# Patient Record
Sex: Male | Born: 2000
Health system: Southern US, Community
[De-identification: ages and names within clinical notes are randomized; demographics above are authoritative.]

## PROBLEM LIST (undated history)

## (undated) DIAGNOSIS — L0291 Cutaneous abscess, unspecified: Secondary | ICD-10-CM

## (undated) DIAGNOSIS — T7840XA Allergy, unspecified, initial encounter: Secondary | ICD-10-CM

## (undated) HISTORY — DX: Allergy, unspecified, initial encounter: T78.40XA

## (undated) HISTORY — PX: FRACTURE SURGERY: SHX138

## (undated) HISTORY — PX: TONSILLECTOMY AND ADENOIDECTOMY: SUR1326

## (undated) HISTORY — PX: HYDROCELE EXCISION / REPAIR: SUR1145

---
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
## 2020-01-03 ENCOUNTER — Encounter (INDEPENDENT_AMBULATORY_CARE_PROVIDER_SITE_OTHER): Payer: Self-pay

## 2020-01-10 ENCOUNTER — Ambulatory Visit (INDEPENDENT_AMBULATORY_CARE_PROVIDER_SITE_OTHER): Payer: 59 | Admitting: Family Medicine

## 2020-01-11 ENCOUNTER — Ambulatory Visit: Payer: 59 | Admitting: Nurse Practitioner

## 2020-01-24 ENCOUNTER — Ambulatory Visit (INDEPENDENT_AMBULATORY_CARE_PROVIDER_SITE_OTHER): Payer: 59 | Admitting: Family Medicine

## 2020-01-29 ENCOUNTER — Ambulatory Visit (HOSPITAL_COMMUNITY): Payer: Self-pay

## 2020-02-07 MED FILL — DOXYCYCLINE MONO 100 MG CAP: 100 | 7 days supply | Qty: 14 | Fill #0

## 2020-02-07 MED FILL — METRONIDAZOLE 500 MG TABS: 500 | 7 days supply | Qty: 21 | Fill #0

## 2020-02-08 ENCOUNTER — Emergency Department (HOSPITAL_BASED_OUTPATIENT_CLINIC_OR_DEPARTMENT_OTHER)
Admission: EM | Admit: 2020-02-08 | Discharge: 2020-02-08 | Disposition: A | Discharge status: Home / Self Care | Payer: 59 | Attending: Emergency Medicine | Admitting: Emergency Medicine

## 2020-02-08 ENCOUNTER — Other Ambulatory Visit: Payer: Self-pay

## 2020-02-08 ENCOUNTER — Encounter (HOSPITAL_BASED_OUTPATIENT_CLINIC_OR_DEPARTMENT_OTHER): Payer: Self-pay | Admitting: *Deleted

## 2020-02-08 DIAGNOSIS — L0501 Pilonidal cyst with abscess: Secondary | ICD-10-CM | POA: Insufficient documentation

## 2020-02-08 DIAGNOSIS — R509 Fever, unspecified: Secondary | ICD-10-CM | POA: Insufficient documentation

## 2020-02-08 MED ORDER — ONDANSETRON 4 MG PO TBDP: 4.0000 mg | ORAL_TABLET | Freq: Three times a day (TID) | ORAL | 0 refills | Status: DC | PRN

## 2020-02-08 MED ORDER — ONDANSETRON 4 MG PO TBDP
4.0000 mg | ORAL_TABLET | Freq: Once | ORAL | Status: AC
  Administered 2020-02-08: 4 mg via ORAL
  Filled 2020-02-08: qty 1

## 2020-02-08 MED ORDER — LIDOCAINE-EPINEPHRINE (PF) 2 %-1:200000 IJ SOLN
20.0000 mL | Freq: Once | INTRAMUSCULAR | Status: AC
  Administered 2020-02-08: 20 mL via INTRADERMAL
  Filled 2020-02-08: qty 20

## 2020-02-08 MED ORDER — OXYCODONE-ACETAMINOPHEN 5-325 MG PO TABS
1.0000 | ORAL_TABLET | Freq: Once | ORAL | Status: AC
  Administered 2020-02-08: 1 via ORAL
  Filled 2020-02-08: qty 1

## 2020-02-08 MED ORDER — HYDROCODONE-ACETAMINOPHEN 5-325 MG PO TABS: 1.0000 | ORAL_TABLET | Freq: Four times a day (QID) | ORAL | 0 refills | Status: DC | PRN

## 2020-02-08 NOTE — ED Triage Notes (Signed)
Abscess to his lower back x 4 days.

## 2020-02-08 NOTE — ED Provider Notes (Signed)
[facility] EMERGENCY DEPARTMENT Provider Note   CSN: [id number] Arrival date & time: 02/08/20  1716     History Chief Complaint  Patient presents with  . Abscess    Donald Brooks is a 19 y.o. male who presents for evaluation of pain, redness, swelling noted to lower buttock x4 days.  Patient reports that the area has been painful to sit on.  He states initially, was very small but it kept getting worse.  He did a telehealth visit yesterday and was started on doxycycline and Flagyl which she has only taken 1 dose of.  He states that in the last for 4 hours, the area has become more painful, more swollen.  He also does report he has had fevers at home.  He states his fever today was 103.  He has been taking Advil and Tylenol.  He his last dose was about 5 PM this evening.  He states he has not had any abdominal pain.  He had 1-2 episodes of vomiting a few days ago that he attributed to pain.  He has been able to eat and drink.  He has not had any pain in his perineum.  He has not had any cough, chest pain, difficulty breathing, any other rash.  He has had Covid vaccines.  No exposure to Covid that he knows of.  The history is provided by the patient.       Past Medical History:  Diagnosis Date  . Abscess     Patient Active Problem List   Diagnosis Date Noted  . Elevated triglycerides with high cholesterol 12/26/2019  . Nausea 12/26/2019  . Chronic diarrhea 12/26/2019  . Left leg pain 06/27/2018  . Acanthosis nigricans 02/13/2018  . Low HDL (under 40) 02/13/2018  . Inattention 11/23/2017  . Severe obesity due to excess calories without serious comorbidity with body mass index (BMI) greater than 99th percentile for age in pediatric patient (HCC) 11/23/2017  . Lower leg mass, left 11/23/2017  . Epistaxis 04/27/2017    Past Surgical History:  Procedure Laterality Date  . FRACTURE SURGERY Left    fracture left wrist 2006  . HYDROCELE EXCISION / REPAIR    .  TONSILLECTOMY AND ADENOIDECTOMY         Family History  Problem Relation Age of Onset  . Diabetes Mother   . Hyperlipidemia Mother   . Hypothyroidism Mother   . Hypertension Father   . Cancer Maternal Grandmother        thyroid  . Sudden death Neg Hx   . Heart attack Neg Hx     Social History   Tobacco Use  . Smoking status: Never Smoker  . Smokeless tobacco: Never Used  Vaping Use  . Vaping Use: Every day  . Substances: Nicotine  Substance Use Topics  . Alcohol use: No    Alcohol/week: 0.0 standard drinks  . Drug use: No    Home Medications Prior to Admission medications   Medication Sig Start Date End Date Taking? Authorizing Provider  famotidine (PEPCID) 20 MG tablet Take 1 tablet (20 mg total) by mouth 2 (two) times daily. 12/26/19   Nche, Bonna Gainsharlotte Lum, NP  HYDROcodone-acetaminophen (NORCO/VICODIN) 5-325 MG tablet Take 1-2 tablets by mouth every 6 (six) hours as needed. 02/08/20   Maxwell CaulLayden, Jyasia Markoff A, PA-C  ondansetron (ZOFRAN ODT) 4 MG disintegrating tablet Take 1 tablet (4 mg total) by mouth every 8 (eight) hours as needed for nausea or vomiting. 02/08/20   Maxwell CaulLayden, Allexus Ovens A,  PA-C  psyllium (HYDROCIL/METAMUCIL) 95 % PACK Take 1 packet by mouth daily. 12/26/19   Nche, Bonna Gains, NP    Allergies    Sulfa antibiotics and Sulfamethoxazole  Review of Systems   Review of Systems  Constitutional: Positive for fever.  Respiratory: Negative for cough and shortness of breath.   Cardiovascular: Negative for chest pain.  Gastrointestinal: Negative for abdominal pain.  Genitourinary: Negative for dysuria and hematuria.  Skin: Positive for color change.  All other systems reviewed and are negative.   Physical Exam Updated Vital Signs BP (!) 142/78 (BP Location: Left Arm)   Pulse 100   Temp 98.7 F (37.1 C) (Oral)   Resp 18   Ht 6' 4.25" (1.937 m)   Wt (!) 178.3 kg   SpO2 99%   BMI 47.52 kg/m   Physical Exam Vitals and nursing note reviewed.  Constitutional:        Appearance: Normal appearance. He is well-developed.  HENT:     Head: Normocephalic and atraumatic.  Eyes:     General: Lids are normal.     Conjunctiva/sclera: Conjunctivae normal.     Pupils: Pupils are equal, round, and reactive to light.  Cardiovascular:     Rate and Rhythm: Normal rate and regular rhythm.     Pulses: Normal pulses.     Heart sounds: Normal heart sounds. No murmur heard.  No friction rub. No gallop.   Pulmonary:     Effort: Pulmonary effort is normal.  Abdominal:     Palpations: Abdomen is soft. Abdomen is not rigid.     Tenderness: There is no abdominal tenderness. There is no guarding.     Comments: Abdomen is soft, non-distended, non-tender. No rigidity, No guarding. No peritoneal signs.  Genitourinary:    Rectum: Normal. No tenderness.       Comments: The exam was performed with a chaperone present Ines Bloomer, Charity fundraiser).  Exam limited to patient comfort and position.  He was laying on his abdomen.  Right pilonidal area has a large area of warmth, induration, swelling with a central fluctuance noted towards the intergluteal cleft consistent with pilonidal abscess.  No tenderness palpation noted to the perineal area.  No crepitus.  Unable to visualize or palpate testicles. DRE showed no evidence of abscess to the perianal area. No tenderness noted on DRE.  Musculoskeletal:        General: Normal range of motion.     Cervical back: Full passive range of motion without pain.  Skin:    General: Skin is warm and dry.     Capillary Refill: Capillary refill takes less than 2 seconds.  Neurological:     Mental Status: He is alert and oriented to person, place, and time.  Psychiatric:        Speech: Speech normal.     ED Results / Procedures / Treatments   Labs (all labs ordered are listed, but only abnormal results are displayed) Labs Reviewed - No data to display  EKG None  Radiology No results found.  Procedures .Marland KitchenIncision and Drainage  Date/Time:  02/08/2020 10:57 PM Performed by: Maxwell Caul, PA-C Authorized by: Maxwell Caul, PA-C   Consent:    Consent obtained:  Verbal   Consent given by:  Patient   Risks discussed:  Bleeding, incomplete drainage, pain and damage to other organs   Alternatives discussed:  No treatment Universal protocol:    Procedure explained and questions answered to patient or proxy's satisfaction: yes  Relevant documents present and verified: yes     Test results available and properly labeled: yes     Imaging studies available: yes     Required blood products, implants, devices, and special equipment available: yes     Site/side marked: yes     Immediately prior to procedure a time out was called: yes     Patient identity confirmed:  Verbally with patient Location:    Type:  Abscess   Location:  Anogenital   Anogenital location:  Pilonidal Pre-procedure details:    Skin preparation:  Betadine Anesthesia (see MAR for exact dosages):    Anesthesia method:  Local infiltration   Local anesthetic:  Lidocaine 2% WITH epi Procedure type:    Complexity:  Complex Procedure details:    Incision types:  Single straight   Incision depth:  Subcutaneous   Scalpel blade:  11   Wound management:  Probed and deloculated, irrigated with saline and extensive cleaning   Drainage:  Purulent   Drainage amount:  Copious   Packing materials:  1/4 in gauze Post-procedure details:    Patient tolerance of procedure:  Tolerated well, no immediate complications Comments:     Once the area was anesthetized, sterilely simply cleaned with Betadine.  The area was opened up and showed a copious amount of purulent drainage.  This was evaluated and deloculated with blunt hemostats.  Given the size of pilonidal abscess, packing was placed.   (including critical care time)  Medications Ordered in ED Medications  oxyCODONE-acetaminophen (PERCOCET/ROXICET) 5-325 MG per tablet 1 tablet (1 tablet Oral Given 02/08/20  2145)  lidocaine-EPINEPHrine (XYLOCAINE W/EPI) 2 %-1:200000 (PF) injection 20 mL (20 mLs Intradermal Given 02/08/20 2147)  ondansetron (ZOFRAN-ODT) disintegrating tablet 4 mg (4 mg Oral Given 02/08/20 2145)    ED Course  I have reviewed the triage vital signs and the nursing notes.  Pertinent labs & imaging results that were available during my care of the patient were reviewed by me and considered in my medical decision making (see chart for details).    MDM Rules/Calculators/A&P                          19 year old male who presents for evaluation of pain noted to buttock x4 to 5 days.  He also reports he has had fevers at home.  Went to a telehealth visit the other day and was prescribed Doxy and Flagyl which he has taken for 1 day.  He states that the area became larger, more swollen, more painful.  Mom and patient report fever at home.  Today had a fever of 103.  He has not had any other symptoms.  On exam, he has a large area of warmth, erythema, induration with central fluctuance noted towards the gluteal cleft consistent with pilonidal abscess.  No tenderness noted on DRE.  Exam is not consistent with perirectal abscess.  Patient is afebrile here in the ED.  He has no other source of infection.  I did discuss with mom and patient regarding Covid testing but they declined.  He is overall well-appearing.  He does appear uncomfortable secondary to the pilonidal abscess but otherwise does not look ill appearing.  At this time, do not feel that he needs further lab work, imaging for evaluation.  His exam is not concerning for Fournier's. We will plan for I&D.  Discussed with mom and patient and they are agreeable.  I&D as documented above.  Patient tolerated  procedure well.  Patient is currently on doxycycline.  Encouraged him to take it.  Patient instructed on wound care precautions instructed to return to the ED in 2 days for wound check.  We will plan for short course premedication given acute  pain. At this time, patient exhibits no emergent life-threatening condition that require further evaluation in ED. Discussed patient with Dr. Bernette Mayers who is agreeable to plan. Patient had ample opportunity for questions and discussion. All patient's questions were answered with full understanding. Strict return precautions discussed. Patient expresses understanding and agreement to plan.   Portions of this note were generated with Scientist, clinical (histocompatibility and immunogenetics). Dictation errors may occur despite best attempts at proofreading.   Final Clinical Impression(s) / ED Diagnoses Final diagnoses:  Pilonidal abscess    Rx / DC Orders ED Discharge Orders         Ordered    ondansetron (ZOFRAN ODT) 4 MG disintegrating tablet  Every 8 hours PRN        02/08/20 2302    HYDROcodone-acetaminophen (NORCO/VICODIN) 5-325 MG tablet  Every 6 hours PRN        02/08/20 2302           Maxwell Caul, PA-C 02/08/20 2321    Pollyann Savoy, MD 02/08/20 2324

## 2020-02-08 NOTE — ED Notes (Signed)
Pt teaching provided on medications that may cause drowsiness. Pt instructed not to drive or operate heavy machinery while taking the prescribed medication. Pt verbalized understanding.   

## 2020-11-19 ENCOUNTER — Encounter: Payer: Self-pay | Admitting: Physician Assistant

## 2020-12-18 ENCOUNTER — Ambulatory Visit: Payer: 59 | Admitting: Physician Assistant

## 2021-01-12 ENCOUNTER — Other Ambulatory Visit (HOSPITAL_COMMUNITY): Payer: Self-pay

## 2021-01-12 ENCOUNTER — Ambulatory Visit
Admission: EM | Admit: 2021-01-12 | Discharge: 2021-01-12 | Disposition: A | Discharge status: Home / Self Care | Payer: 59 | Attending: Emergency Medicine | Admitting: Emergency Medicine

## 2021-01-12 ENCOUNTER — Other Ambulatory Visit: Payer: Self-pay

## 2021-01-12 ENCOUNTER — Ambulatory Visit (INDEPENDENT_AMBULATORY_CARE_PROVIDER_SITE_OTHER): Payer: 59

## 2021-01-12 DIAGNOSIS — M25512 Pain in left shoulder: Secondary | ICD-10-CM | POA: Diagnosis not present

## 2021-01-12 MED ORDER — PREDNISONE 20 MG PO TABS
40.0000 mg | ORAL_TABLET | Freq: Every day | ORAL | 0 refills | Status: DC
  Filled 2021-01-12: qty 10, 5d supply, fill #0

## 2021-01-12 MED ORDER — MELOXICAM 7.5 MG PO TABS
7.5000 mg | ORAL_TABLET | Freq: Every day | ORAL | 0 refills | Status: DC
  Filled 2021-01-12: qty 15, 15d supply, fill #0

## 2021-01-12 NOTE — ED Triage Notes (Signed)
Pt c/o lt shoulder pain x2 days with decrease mobility, denies injury.

## 2021-01-12 NOTE — Discharge Instructions (Addendum)
Rating todayYour x-ray today did not show injury to the bone, ligaments or tendons of your shoulder  You may take prednisone each morning with a little bit of food for the next 5 days  After completion of prednisone take meloxicam every morning with food for 5 days then may use medication as needed  Days worth of medication if still having pain or discomfort please follow-up with orthopedic specialist, information listed below, you may go to any orthopedic specialist of your choosing  You can apply heating pad in 15-minute intervals to affected area for additional comfort  As pain lessens begin to move arm around to help loosen tension

## 2021-01-12 NOTE — ED Provider Notes (Signed)
[facility]    CSN: [id number] Arrival date & time: 01/12/21  9528      History   Chief Complaint Chief Complaint  Patient presents with   Shoulder Pain    HPI Donald Brooks is a 20 y.o. male.   Patient presents with left shoulder pain beginning 2 days ago upon awakening.  Denies any precipitating event, injury or trauma, numbness, tingling, radiating pain.  Denies pushing, pulling or lifting over the last week.  Limited range of motion.  Attempted treatment.  Mother at bedside.  Past Medical History:  Diagnosis Date   Abscess     Patient Active Problem List   Diagnosis Date Noted   Elevated triglycerides with high cholesterol 12/26/2019   Nausea 12/26/2019   Chronic diarrhea 12/26/2019   Left leg pain 06/27/2018   Acanthosis nigricans 02/13/2018   Low HDL (under 40) 02/13/2018   Inattention 11/23/2017   Severe obesity due to excess calories without serious comorbidity with body mass index (BMI) greater than 99th percentile for age in pediatric patient (HCC) 11/23/2017   Lower leg mass, left 11/23/2017   Epistaxis 04/27/2017    Past Surgical History:  Procedure Laterality Date   FRACTURE SURGERY Left    fracture left wrist 2006   HYDROCELE EXCISION / REPAIR     TONSILLECTOMY AND ADENOIDECTOMY         Home Medications    Prior to Admission medications   Not on File    Family History Family History  Problem Relation Age of Onset   Diabetes Mother    Hyperlipidemia Mother    Hypothyroidism Mother    Hypertension Father    Cancer Maternal Grandmother        thyroid   Sudden death Neg Hx    Heart attack Neg Hx     Social History Social History   Tobacco Use   Smoking status: Never   Smokeless tobacco: Never  Vaping Use   Vaping Use: Every day   Substances: Nicotine  Substance Use Topics   Alcohol use: No    Alcohol/week: 0.0 standard drinks   Drug use: No     Allergies   Sulfa antibiotics and  Sulfamethoxazole   Review of Systems Review of Systems  Constitutional: Negative.   Respiratory: Negative.    Cardiovascular: Negative.   Skin: Negative.   Neurological: Negative.     Physical Exam Triage Vital Signs ED Triage Vitals [01/12/21 0950]  Enc Vitals Group     BP 139/83     Pulse Rate 67     Resp 18     Temp 97.9 F (36.6 C)     Temp Source Oral     SpO2 98 %     Weight      Height      Head Circumference      Peak Flow      Pain Score 7     Pain Loc      Pain Edu?      Excl. in GC?    No data found.  Updated Vital Signs BP 139/83 (BP Location: Right Arm)   Pulse 67   Temp 97.9 F (36.6 C) (Oral)   Resp 18   SpO2 98%   Visual Acuity Right Eye Distance:   Left Eye Distance:   Bilateral Distance:    Right Eye Near:   Left Eye Near:    Bilateral Near:     Physical Exam Constitutional:  Appearance: Normal appearance. He is normal weight.  HENT:     Head: Normocephalic.  Eyes:     Extraocular Movements: Extraocular movements intact.  Pulmonary:     Effort: Pulmonary effort is normal.  Musculoskeletal:     Comments: Tenderness over the right acromion, no point tenderness over the anterior posterior or lateral aspects of shoulder, forward flexion and abduction limited to 15 to 30 degrees, able to fully extend  Skin:    General: Skin is warm and dry.  Neurological:     Mental Status: He is alert and oriented to person, place, and time. Mental status is at baseline.  Psychiatric:        Behavior: Behavior normal.     UC Treatments / Results  Labs (all labs ordered are listed, but only abnormal results are displayed) Labs Reviewed - No data to display  EKG   Radiology No results found.  Procedures Procedures (including critical care time)  Medications Ordered in UC Medications - No data to display  Initial Impression / Assessment and Plan / UC Course  I have reviewed the triage vital signs and the nursing  notes.  Pertinent labs & imaging results that were available during my care of the patient were reviewed by me and considered in my medical decision making (see chart for details).  Acute left shoulder pain  1.  X-ray negative 2.  Prednisone 40 mg daily for 5 days 3.  After completion of prednisone, meloxicam 7.5 mg daily for 5 days then as needed 4.  Heating pad in 15-minute intervals for additional comfort 5.  Gentle stretching as tolerated 6.  If pain is persistent after completion of medication advised follow-up with orthopedic specialist for further evaluation Final Clinical Impressions(s) / UC Diagnoses   Final diagnoses:  None   Discharge Instructions   None    ED Prescriptions   None    PDMP not reviewed this encounter.   Valinda Hoar, NP 01/12/21 1120

## 2021-01-16 ENCOUNTER — Other Ambulatory Visit (HOSPITAL_COMMUNITY): Payer: Self-pay

## 2021-07-10 IMAGING — CT CT ABDOMEN AND PELVIS WITH CONTRAST
2 of 4 series · 17 of 46 positions shown, 19 images · IV contrast (OMNIPAQUE)
Comparison: None.

CLINICAL DATA: Abdominal pain

EXAM:
CT ABDOMEN AND PELVIS WITH CONTRAST
TECHNIQUE: Multidetector CT imaging of the abdomen and pelvis was performed
using the standard protocol following bolus administration of
intravenous contrast.
CONTRAST:  100mL OMNIPAQUE IOHEXOL 300 MG/ML  SOLN

[Series 2: axial st · axial · 0.97mm/px · z∈[+1072,+1522]mm · 14 of 102 slices shown, 16 images]
[im 6/102  soft-tissue]
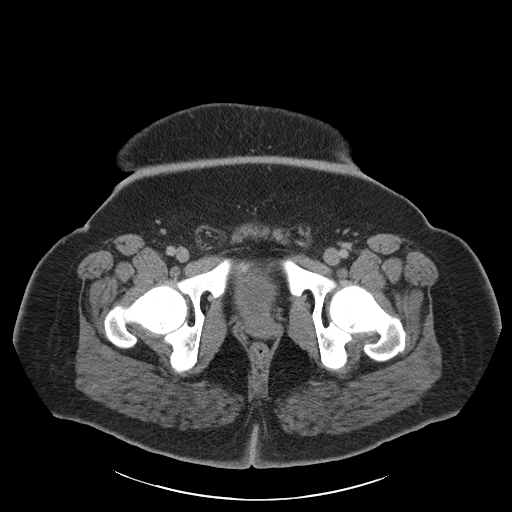
[im 6/102  bone]
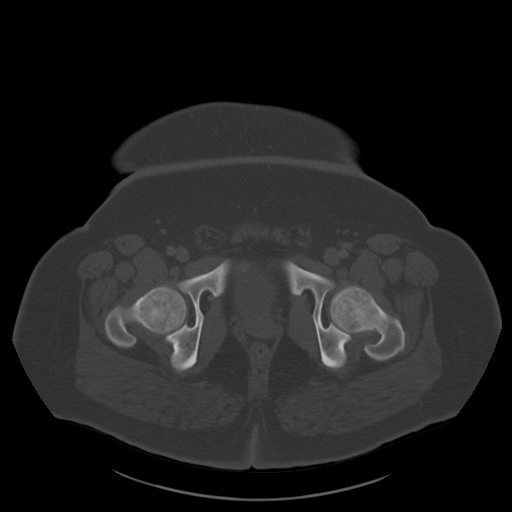
[im 11/102  soft-tissue]
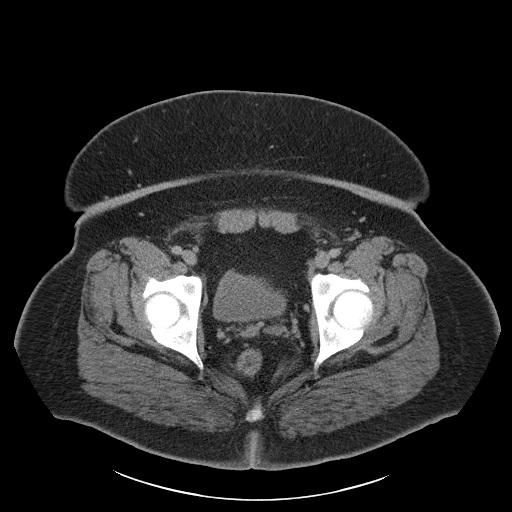
[im 22/102  soft-tissue]
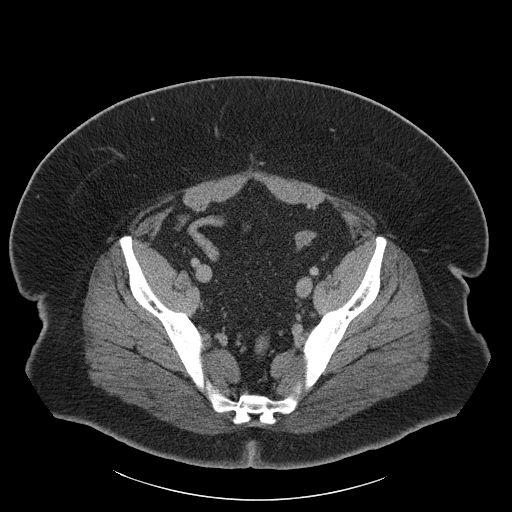
[im 27/102  soft-tissue]
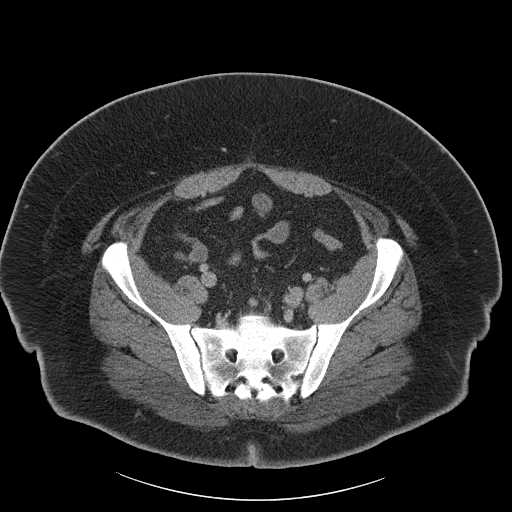
[im 32/102  soft-tissue]
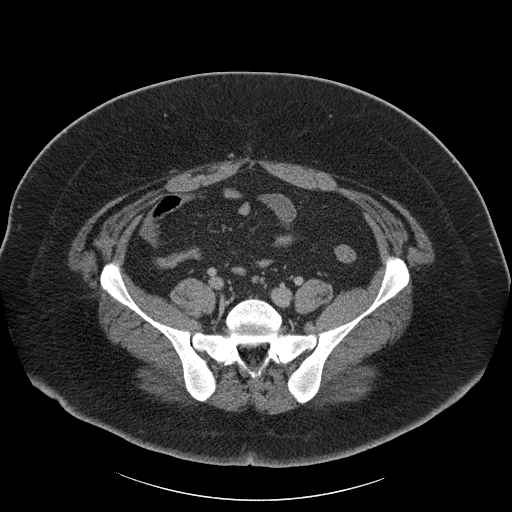
[im 43/102  soft-tissue]
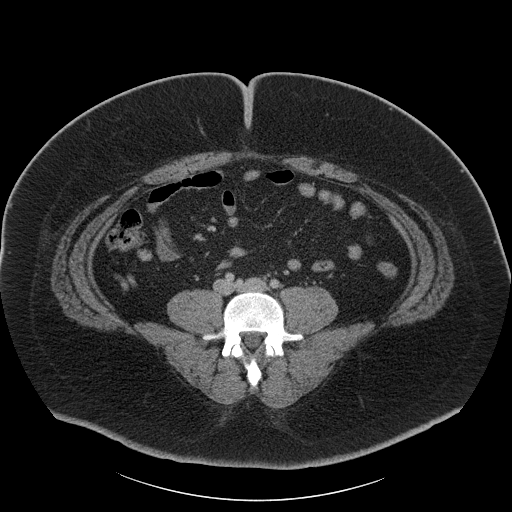
[im 48/102  soft-tissue]
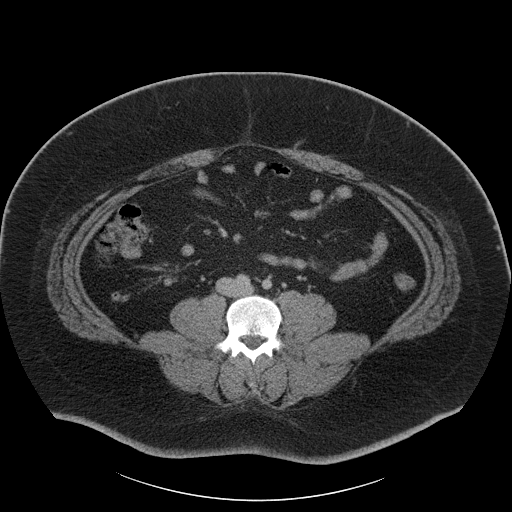
[im 54/102  soft-tissue]
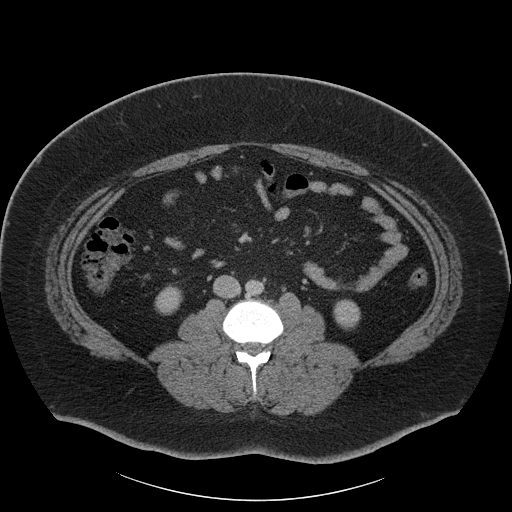
[im 59/102  soft-tissue]
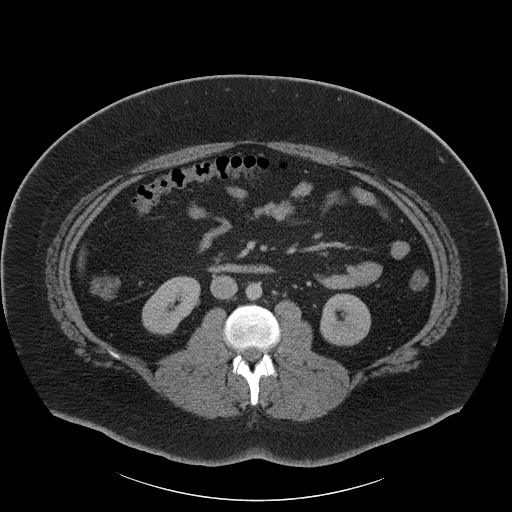
[im 59/102  bone]
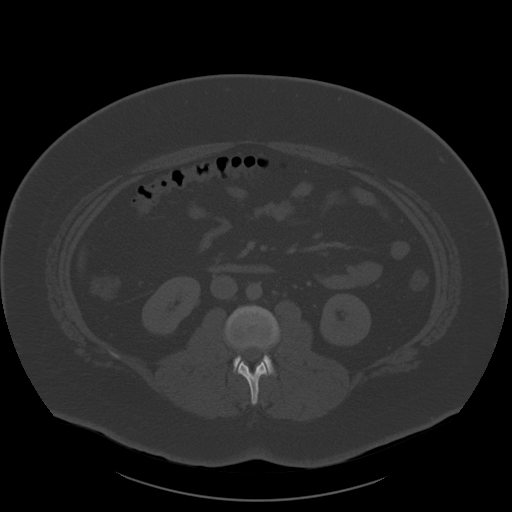
[im 70/102  soft-tissue]
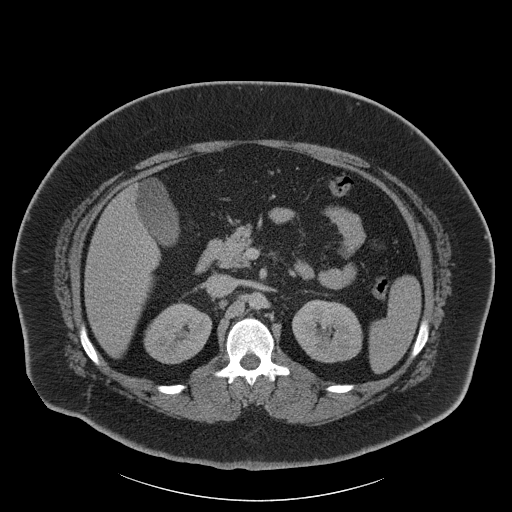
[im 75/102  soft-tissue]
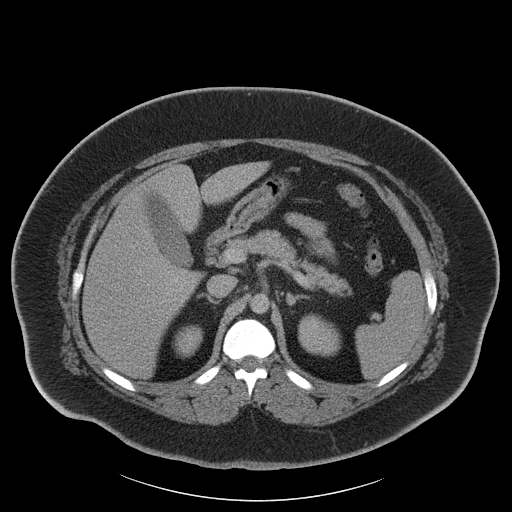
[im 80/102  soft-tissue]
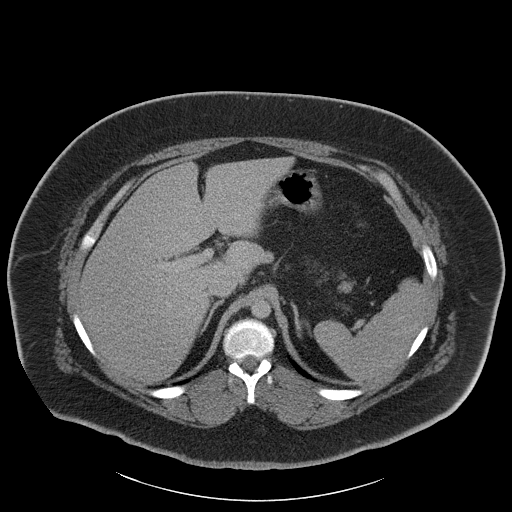
[im 91/102  soft-tissue]
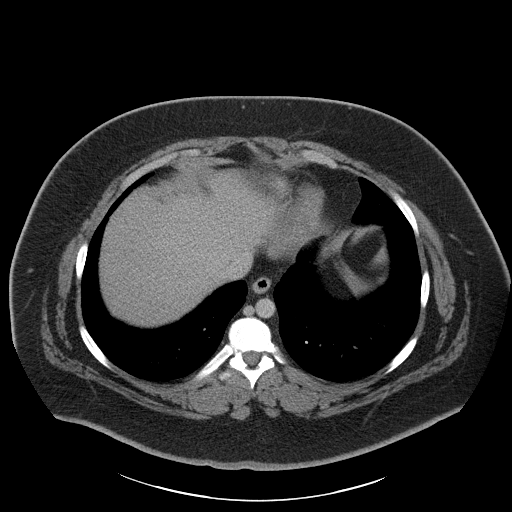
[im 96/102  soft-tissue]
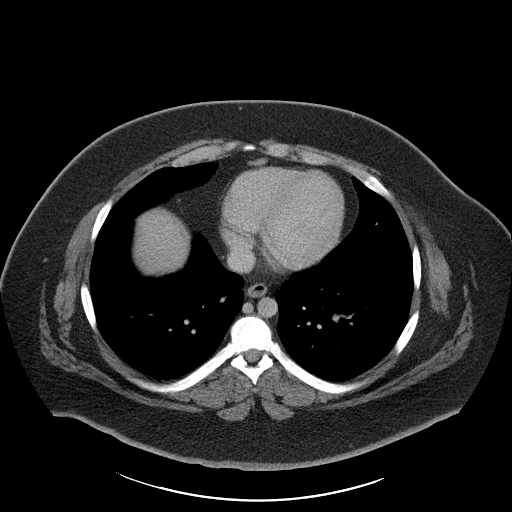

[Series 5: coronal st · coronal · 0.97mm/px · 3 of 179 slices shown]
[im 60/179  soft-tissue]
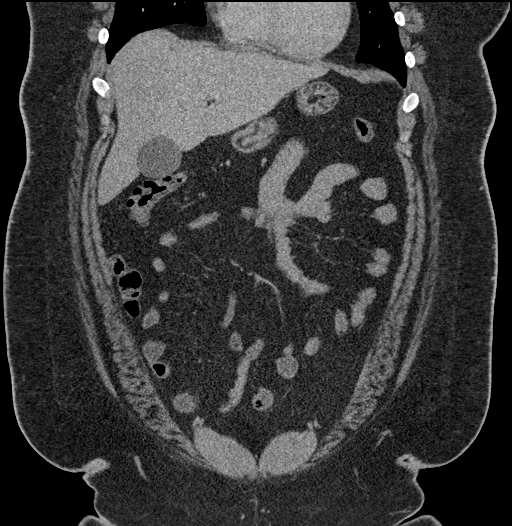
[im 80/179  soft-tissue]
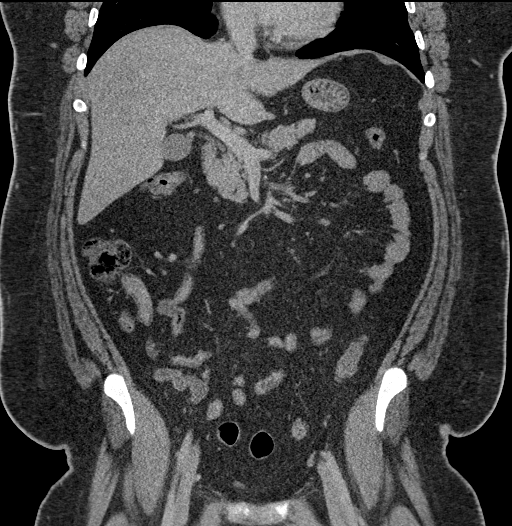
[im 99/179  soft-tissue]
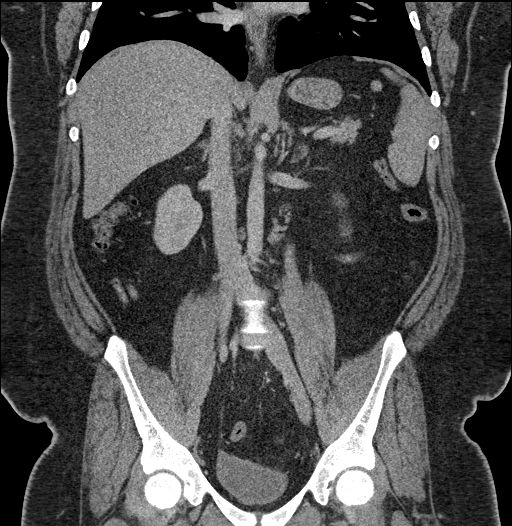

[17 of 46 positions shown; findings below may reference images not displayed]

FINDINGS: LOWER CHEST: There is no basilar pleural or apical pericardial
effusion.

HEPATOBILIARY: The hepatic contours and density are normal. There is
no intra- or extrahepatic biliary dilatation. The gallbladder is
normal.

PANCREAS: The pancreatic parenchymal contours are normal and there
is no ductal dilatation. There is no peripancreatic fluid
collection.

SPLEEN: Normal.

ADRENALS/URINARY TRACT:

--Adrenal glands: Normal.

--Right kidney/ureter: No hydronephrosis, nephroureterolithiasis,
perinephric stranding or solid renal mass.

--Left kidney/ureter: No hydronephrosis, nephroureterolithiasis,
perinephric stranding or solid renal mass.

--Urinary bladder: Normal for degree of distention

STOMACH/BOWEL:

--Stomach/Duodenum: There is no hiatal hernia or other gastric
abnormality. The duodenal course and caliber are normal.

--Small bowel: No dilatation or inflammation.

--Colon: No focal abnormality.

--Appendix: Normal.

VASCULAR/LYMPHATIC: Normal course and caliber of the major abdominal
vessels. No abdominal or pelvic lymphadenopathy.

REPRODUCTIVE: No free fluid in the pelvis.

MUSCULOSKELETAL. No bony spinal canal stenosis or focal osseous
abnormality.

OTHER: None.
IMPRESSION: Normal appendix.  No acute abnormality of the abdomen or pelvis.

## 2021-11-11 ENCOUNTER — Ambulatory Visit: Payer: 59 | Admitting: Family Medicine

## 2021-11-11 ENCOUNTER — Telehealth: Payer: Self-pay | Admitting: Nurse Practitioner

## 2021-11-11 NOTE — Telephone Encounter (Signed)
OK 

## 2021-11-11 NOTE — Telephone Encounter (Signed)
Pt would like to request a TOC to Dr.Wendling. please advise.

## 2021-12-09 ENCOUNTER — Encounter: Payer: Self-pay | Admitting: Family Medicine

## 2021-12-09 ENCOUNTER — Ambulatory Visit: Payer: BC Managed Care – PPO | Admitting: Family Medicine

## 2021-12-09 ENCOUNTER — Other Ambulatory Visit (HOSPITAL_COMMUNITY): Payer: Self-pay

## 2021-12-09 VITALS — BP 136/86 | HR 71 | Temp 99.5°F | Ht 76.0 in | Wt 373.4 lb

## 2021-12-09 DIAGNOSIS — Z1159 Encounter for screening for other viral diseases: Secondary | ICD-10-CM | POA: Diagnosis not present

## 2021-12-09 DIAGNOSIS — Z0000 Encounter for general adult medical examination without abnormal findings: Secondary | ICD-10-CM | POA: Diagnosis not present

## 2021-12-09 DIAGNOSIS — Z23 Encounter for immunization: Secondary | ICD-10-CM

## 2021-12-09 DIAGNOSIS — F40243 Fear of flying: Secondary | ICD-10-CM | POA: Diagnosis not present

## 2021-12-09 MED ORDER — SEMAGLUTIDE-WEIGHT MANAGEMENT 2.4 MG/0.75ML ~~LOC~~ SOAJ
2.4000 mg | SUBCUTANEOUS | 0 refills | Status: DC
  Filled 2021-12-09 – 2022-03-31 (×2): qty 3, 28d supply, fill #0

## 2021-12-09 MED ORDER — SEMAGLUTIDE-WEIGHT MANAGEMENT 1.7 MG/0.75ML ~~LOC~~ SOAJ
1.7000 mg | SUBCUTANEOUS | 0 refills | Status: AC
  Filled 2021-12-09 – 2022-03-03 (×2): qty 3, 28d supply, fill #0

## 2021-12-09 MED ORDER — DIAZEPAM 5 MG PO TABS
ORAL_TABLET | ORAL | 0 refills | Status: DC
  Filled 2021-12-09: qty 20, 20d supply, fill #0

## 2021-12-09 MED ORDER — SEMAGLUTIDE-WEIGHT MANAGEMENT 0.25 MG/0.5ML ~~LOC~~ SOAJ
0.2500 mg | SUBCUTANEOUS | 0 refills | Status: AC
  Filled 2021-12-09 – 2021-12-17 (×2): qty 2, 28d supply, fill #0

## 2021-12-09 MED ORDER — SEMAGLUTIDE-WEIGHT MANAGEMENT 1 MG/0.5ML ~~LOC~~ SOAJ
1.0000 mg | SUBCUTANEOUS | 0 refills | Status: AC
  Filled 2021-12-09 – 2022-02-04 (×2): qty 2, 28d supply, fill #0

## 2021-12-09 MED ORDER — SEMAGLUTIDE-WEIGHT MANAGEMENT 0.5 MG/0.5ML ~~LOC~~ SOAJ
0.5000 mg | SUBCUTANEOUS | 0 refills | Status: AC
  Filled 2021-12-09 – 2022-01-13 (×2): qty 2, 28d supply, fill #0

## 2021-12-09 NOTE — Progress Notes (Signed)
Chief Complaint  Patient presents with   New Patient (Initial Visit)    Well Male Donald Brooks is here for a complete physical/transfer of care.   His last physical was >1 year ago.  Current diet: in general, a "healthy" diet.   Current exercise: wt resistance exercise, some cardio Weight trend: intentionally losing Fatigue out of ordinary? No. Seat belt? Yes.    Health maintenance Tetanus- Yes HIV- Yes Hep C- No  Obesity Has had issues losing weight in past. Diet/exercise as above. Referred bariatric surgeyr in the past, underwent the courses at [facility] where things were supervised. He did not get the surgery, covid came and now he is interested in [place].   Past Medical History:  Diagnosis Date   Abscess    Allergy      Past Surgical History:  Procedure Laterality Date   FRACTURE SURGERY Left    fracture left wrist 2006   HYDROCELE EXCISION / REPAIR     TONSILLECTOMY AND ADENOIDECTOMY      Medications  Takes no meds routinely.    Allergies Allergies  Allergen Reactions   Sulfa Antibiotics Swelling and Rash   Sulfamethoxazole Rash    Family History Family History  Problem Relation Age of Onset   Diabetes Mother    Hyperlipidemia Mother    Hypothyroidism Mother    Diabetes Father    Hypertension Father    Birth defects Son    Cancer Maternal Grandmother        thyroid   Sudden death Neg Hx    Heart attack Neg Hx     Review of Systems: Constitutional: no fevers or chills Eye:  no recent significant change in vision Ear/Nose/Mouth/Throat:  Ears:  no hearing loss Nose/Mouth/Throat:  no complaints of nasal congestion, no sore throat Cardiovascular:  no chest pain Respiratory:  no shortness of breath Gastrointestinal:  no abdominal pain, no change in bowel habits GU:  Male: negative for dysuria Musculoskeletal/Extremities:  no pain of the joints Integumentary (Skin/Breast):  no abnormal skin lesions reported Neurologic:  no headaches Endocrine:  No unexpected weight gain Hematologic/Lymphatic:  no night sweats  Exam BP 136/86   Pulse 71   Temp 99.5 F (37.5 C) (Oral)   Ht 6\' 4"  (1.93 m)   Wt (!) 373 lb 6 oz (169.4 kg)   SpO2 98%   BMI 45.45 kg/m  General:  well developed, well nourished, in no apparent distress Skin:  no significant moles, warts, or growths Head:  no masses, lesions, or tenderness Eyes:  pupils equal and round, sclera anicteric without injection Ears:  canals without lesions, TMs shiny without retraction, no obvious effusion, no erythema Nose:  nares patent, septum midline, mucosa normal Throat/Pharynx:  lips and gingiva without lesion; tongue and uvula midline; non-inflamed pharynx; no exudates or postnasal drainage Neck: neck supple without adenopathy, thyromegaly, or masses Lungs:  clear to auscultation, breath sounds equal bilaterally, no respiratory distress Cardio:  regular rate and rhythm, no bruits, no LE edema Abdomen:  abdomen soft, nontender; bowel sounds normal; no masses or organomegaly Genital (male): Deferred Rectal: Deferred Musculoskeletal:  symmetrical muscle groups noted without atrophy or deformity Extremities:  no clubbing, cyanosis, or edema, no deformities, no skin discoloration Neuro:  gait normal; deep tendon reflexes normal and symmetric Psych: well oriented with normal range of affect and appropriate judgment/insight  Assessment and Plan  Well adult exam - Plan: CBC, Comprehensive metabolic panel, Lipid panel  Encounter for hepatitis C screening test for low risk  patient - Plan: Hepatitis C antibody  Morbid obesity (HCC) - Plan: TSH, Semaglutide-Weight Management 0.25 MG/0.5ML SOAJ, Semaglutide-Weight Management 0.5 MG/0.5ML SOAJ, Semaglutide-Weight Management 1 MG/0.5ML SOAJ, Semaglutide-Weight Management 1.7 MG/0.75ML SOAJ, Semaglutide-Weight Management 2.4 MG/0.75ML SOAJ  Anxiety with flying - Plan: diazepam (VALIUM) 5 MG tablet  Need for HPV vaccination - Plan: HPV  9-valent vaccine,Recombinat   Well 21 y.o. male. Counseled on diet and exercise. Self testicular exams recommended at least monthly.  Morbid obesity: Chronic, uncontrolled. Start Wegovy and uptitrate until 2.4 mg/week or highest tolerated dosage. Warned about price and availability.  1st HPV vaccine today, next in 1 mo, final in 6 mo.  Other orders as above. Follow up in 1 year pending the above workup. The patient voiced understanding and agreement to the plan.  Jilda Roche Morse, DO 12/09/21 1:34 PM

## 2021-12-09 NOTE — Patient Instructions (Addendum)
Give [place] 2-3 business days to get the results of your labs back.   Keep the diet clean and stay active.  Do monthly self testicular checks in the shower. You are feeling for lumps/bumps that don't belong. If you feel anything like this, let me know!  Consider the HPV vaccine.   Let us know if you need anything.

## 2021-12-10 LAB — LIPID PANEL
Cholesterol: 189 mg/dL (ref 0–200)
HDL: 35 mg/dL — ABNORMAL LOW (ref >39.00)
LDL Cholesterol: 129 mg/dL — ABNORMAL HIGH (ref 0–99)
NonHDL: 154.16
Total CHOL/HDL Ratio: 5
Triglycerides: 126 mg/dL (ref 0.0–149.0)
VLDL: 25.2 mg/dL (ref 0.0–40.0)

## 2021-12-10 LAB — COMPREHENSIVE METABOLIC PANEL
ALT: 27 U/L (ref 0–53)
AST: 16 U/L (ref 0–37)
Albumin: 4.6 g/dL (ref 3.5–5.2)
Alkaline Phosphatase: 35 U/L — ABNORMAL LOW (ref 39–117)
BUN: 13 mg/dL (ref 6–23)
CO2: 27 mEq/L (ref 19–32)
Calcium: 9.7 mg/dL (ref 8.4–10.5)
Chloride: 102 mEq/L (ref 96–112)
Creatinine, Ser: 0.88 mg/dL (ref 0.40–1.50)
GFR: 123.42 mL/min (ref >60.00)
Glucose, Bld: 85 mg/dL (ref 70–99)
Potassium: 4 mEq/L (ref 3.5–5.1)
Sodium: 139 mEq/L (ref 135–145)
Total Bilirubin: 0.7 mg/dL (ref 0.2–1.2)
Total Protein: 7.7 g/dL (ref 6.0–8.3)

## 2021-12-10 LAB — CBC
HCT: 45.1 % (ref 39.0–52.0)
Hemoglobin: 15.1 g/dL (ref 13.0–17.0)
MCHC: 33.5 g/dL (ref 30.0–36.0)
MCV: 86.1 fl (ref 78.0–100.0)
Platelets: 268 10*3/uL (ref 150.0–400.0)
RBC: 5.24 Mil/uL (ref 4.22–5.81)
RDW: 14.4 % (ref 11.5–14.6)
WBC: 6.4 10*3/uL (ref 4.5–10.5)

## 2021-12-10 LAB — HEPATITIS C ANTIBODY: Hepatitis C Ab: NONREACTIVE

## 2021-12-10 LAB — TSH: TSH: 0.91 u[IU]/mL (ref 0.35–5.50)

## 2021-12-12 ENCOUNTER — Other Ambulatory Visit (HOSPITAL_COMMUNITY): Payer: Self-pay

## 2021-12-14 ENCOUNTER — Other Ambulatory Visit (HOSPITAL_COMMUNITY): Payer: Self-pay

## 2021-12-14 ENCOUNTER — Encounter: Payer: Self-pay | Admitting: Family Medicine

## 2021-12-14 ENCOUNTER — Telehealth: Payer: Self-pay

## 2021-12-14 NOTE — Telephone Encounter (Signed)
PA initiated via Covermymeds; KEY: [id number]. Awaiting determination.   Pt's plan is requiring medical records. Unable to attach via Covermymeds; I have asked them to call us with their fax number.

## 2021-12-15 NOTE — Telephone Encounter (Signed)
Harrison Mons with [employer] called to request clinicals for prior auth on Wegovy. Advised that we needed fax number to fax medical records. Fax # is [phone]. He advised that the case number be added to the cover sheet. Case # [id number]

## 2021-12-15 NOTE — Telephone Encounter (Signed)
Notes faxed to number provided.

## 2021-12-16 ENCOUNTER — Other Ambulatory Visit (HOSPITAL_COMMUNITY): Payer: Self-pay

## 2021-12-16 NOTE — Telephone Encounter (Signed)
PA approved.   Request Reference Number: [id number]. WEGOVY INJ 0.25MG  is denied for not meeting the prior authorization requirement(s). Details of this decision are in the notice attached below or have been faxed to you. PAAppealSupportedResponse Appeal Case [id number] is overturned. For further questions, call [phone].

## 2021-12-16 NOTE — Telephone Encounter (Signed)
PA denied because records were not received prior to call from [place] at [place]. Appeal began, awaiting determination.

## 2021-12-17 ENCOUNTER — Other Ambulatory Visit (HOSPITAL_COMMUNITY): Payer: Self-pay

## 2021-12-17 NOTE — Telephone Encounter (Signed)
Received approval for 6 months, through 06/18/2022. Reviewed by [email or web address], RPh Pharmacist reviewer.  Called the patient to inform of approval.

## 2021-12-17 NOTE — Telephone Encounter (Signed)
Patient called back. Advised him that we were calling to advise him that his Donald Brooks was approved for 6 months.

## 2021-12-21 ENCOUNTER — Other Ambulatory Visit (HOSPITAL_COMMUNITY): Payer: Self-pay

## 2021-12-22 ENCOUNTER — Other Ambulatory Visit (HOSPITAL_COMMUNITY): Payer: Self-pay

## 2021-12-23 ENCOUNTER — Other Ambulatory Visit: Payer: Self-pay | Admitting: Family Medicine

## 2021-12-23 DIAGNOSIS — Z111 Encounter for screening for respiratory tuberculosis: Secondary | ICD-10-CM

## 2021-12-25 ENCOUNTER — Ambulatory Visit: Payer: BC Managed Care – PPO | Admitting: Family Medicine

## 2021-12-30 ENCOUNTER — Encounter (INDEPENDENT_AMBULATORY_CARE_PROVIDER_SITE_OTHER): Payer: Self-pay

## 2021-12-31 ENCOUNTER — Other Ambulatory Visit (INDEPENDENT_AMBULATORY_CARE_PROVIDER_SITE_OTHER): Payer: BC Managed Care – PPO

## 2021-12-31 DIAGNOSIS — Z111 Encounter for screening for respiratory tuberculosis: Secondary | ICD-10-CM

## 2022-01-04 LAB — QUANTIFERON-TB GOLD PLUS
Mitogen-NIL: >10 IU/mL
NIL: 0.18 IU/mL
QuantiFERON-TB Gold Plus: NEGATIVE
TB1-NIL: 0.06 IU/mL
TB2-NIL: 0.04 IU/mL

## 2022-01-13 ENCOUNTER — Other Ambulatory Visit (HOSPITAL_COMMUNITY): Payer: Self-pay

## 2022-01-15 ENCOUNTER — Other Ambulatory Visit (HOSPITAL_COMMUNITY): Payer: Self-pay

## 2022-02-04 ENCOUNTER — Other Ambulatory Visit (HOSPITAL_COMMUNITY): Payer: Self-pay

## 2022-03-03 ENCOUNTER — Other Ambulatory Visit (HOSPITAL_COMMUNITY): Payer: Self-pay

## 2022-03-31 ENCOUNTER — Other Ambulatory Visit (HOSPITAL_COMMUNITY): Payer: Self-pay

## 2022-05-03 ENCOUNTER — Other Ambulatory Visit (HOSPITAL_COMMUNITY): Payer: Self-pay

## 2022-05-03 ENCOUNTER — Other Ambulatory Visit: Payer: Self-pay | Admitting: Family Medicine

## 2022-05-03 MED ORDER — WEGOVY 2.4 MG/0.75ML ~~LOC~~ SOAJ
2.4000 mg | SUBCUTANEOUS | 0 refills | Status: DC
  Filled 2022-05-03: qty 3, 28d supply, fill #0

## 2022-05-06 ENCOUNTER — Other Ambulatory Visit (HOSPITAL_COMMUNITY): Payer: Self-pay

## 2022-06-08 ENCOUNTER — Other Ambulatory Visit: Payer: Self-pay | Admitting: Family Medicine

## 2022-06-08 ENCOUNTER — Other Ambulatory Visit (HOSPITAL_COMMUNITY): Payer: Self-pay

## 2022-06-08 MED ORDER — WEGOVY 2.4 MG/0.75ML ~~LOC~~ SOAJ
2.4000 mg | SUBCUTANEOUS | 0 refills | Status: DC
  Filled 2022-06-08: qty 3, 28d supply, fill #0

## 2022-06-08 NOTE — Telephone Encounter (Signed)
Do not see where you have prescribed this and is not on her current list.

## 2022-06-30 ENCOUNTER — Telehealth: Payer: Self-pay

## 2022-06-30 ENCOUNTER — Other Ambulatory Visit: Payer: Self-pay | Admitting: Family Medicine

## 2022-06-30 ENCOUNTER — Other Ambulatory Visit (HOSPITAL_COMMUNITY): Payer: Self-pay

## 2022-06-30 MED ORDER — WEGOVY 2.4 MG/0.75ML ~~LOC~~ SOAJ
2.4000 mg | SUBCUTANEOUS | 0 refills | Status: DC
  Filled 2022-06-30: qty 3, 28d supply, fill #0

## 2022-06-30 NOTE — Telephone Encounter (Signed)
Scheduled 07/05/2022 at 11 AM

## 2022-06-30 NOTE — Telephone Encounter (Signed)
Will need updated OV for weight and BMI documentation for PA renewal please.

## 2022-07-05 ENCOUNTER — Ambulatory Visit: Payer: BC Managed Care – PPO | Admitting: Family Medicine

## 2022-07-05 ENCOUNTER — Other Ambulatory Visit (HOSPITAL_COMMUNITY): Payer: Self-pay

## 2022-07-05 ENCOUNTER — Encounter: Payer: Self-pay | Admitting: Family Medicine

## 2022-07-05 DIAGNOSIS — Z23 Encounter for immunization: Secondary | ICD-10-CM

## 2022-07-05 MED ORDER — WEGOVY 2.4 MG/0.75ML ~~LOC~~ SOAJ
2.4000 mg | SUBCUTANEOUS | 2 refills | Status: DC
  Filled 2022-07-05: qty 3, 28d supply, fill #0

## 2022-07-05 NOTE — Progress Notes (Signed)
Chief Complaint  Patient presents with   Follow-up    [place] Document Weight/Height/BMI    Subjective: Patient is a 22 y.o. male here for f/u.  Taking Wegovy 2.4 mg/week. Compliant, no AE's. Has lost 40 lbs. Diet improved. Cravings are significantly better. Portions are better. Lifting wts, doing cardio. No CP or SOB. Concerned about loose skin.   Past Medical History:  Diagnosis Date   Abscess    Allergy     Objective: BP 120/80 (BP Location: Left Arm, Patient Position: Sitting, Cuff Size: Large)   Pulse 97   Temp 98.7 F (37.1 C) (Oral)   Ht 6' 4"$  (1.93 m)   Wt (!) 333 lb 4 oz (151.2 kg)   SpO2 98%   BMI 40.56 kg/m  General: Awake, appears stated age Heart: RRR, no LE edema Lungs: CTAB, no rales, wheezes or rhonchi. No accessory muscle use Psych: Age appropriate judgment and insight, normal affect and mood  Assessment and Plan: Morbid obesity ([place]) - Plan: Semaglutide-Weight Management (WEGOVY) 2.4 MG/0.75ML SOAJ  Chronic, stable. Cont Wegovy 2.4 mg/week. Counseled on diet/exercise. Commended for his weight loss thus far.  HPV 2/3 today.  Tdap booster today. F/u in 6 mo for CPE or prn.  The patient voiced understanding and agreement to the plan.  [place], DO 07/05/22  11:17 AM

## 2022-07-05 NOTE — Patient Instructions (Signed)
Strong work with your weight loss.  Keep the diet clean and stay active.  Let us know if you need anything.

## 2022-07-05 NOTE — Addendum Note (Signed)
Addended by: Laure Kidney on: 07/05/2022 11:56 AM   Modules accepted: Orders

## 2022-07-06 NOTE — Telephone Encounter (Signed)
PA initiated via Covermymeds; [id number]. Awaiting determination.

## 2022-07-06 NOTE — Telephone Encounter (Signed)
Called the patient no answer/no VM to leave a message.

## 2022-07-06 NOTE — Telephone Encounter (Signed)
Plz inform pt. We could do a 90 d supply of an oral medication but that is not something we can keep him on long term.

## 2022-07-06 NOTE — Telephone Encounter (Signed)
PA denied. Plan exclusion.   The requested medication is not covered by the plan. Medications used for weight loss are not covered under your health plan's pharmacy coverage.

## 2022-07-06 NOTE — Telephone Encounter (Signed)
Called informed the patient of denial He will call back after he talks to his mom what he wants to do.

## 2022-07-08 ENCOUNTER — Other Ambulatory Visit (HOSPITAL_COMMUNITY): Payer: Self-pay

## 2022-07-08 MED ORDER — PHENTERMINE HCL 37.5 MG PO CAPS
37.5000 mg | ORAL_CAPSULE | ORAL | 0 refills | Status: DC
  Filled 2022-07-08: qty 30, 30d supply, fill #0

## 2022-07-08 NOTE — Telephone Encounter (Signed)
Sent for 30 d. I would like to see him in about a mo to see how he is going on this one. Ty.

## 2022-07-08 NOTE — Telephone Encounter (Signed)
Pt called back stating that he would like to do the oral medication that Dr. Nani Ravens was recommending.

## 2022-07-08 NOTE — Addendum Note (Signed)
Addended by: Ames Coupe on: 07/08/2022 04:20 PM   Modules accepted: Orders

## 2022-07-09 ENCOUNTER — Other Ambulatory Visit (HOSPITAL_COMMUNITY): Payer: Self-pay

## 2022-07-09 NOTE — Telephone Encounter (Signed)
Called and scheduled appt

## 2022-08-06 ENCOUNTER — Ambulatory Visit: Payer: BC Managed Care – PPO | Admitting: Family Medicine

## 2022-08-09 ENCOUNTER — Ambulatory Visit: Payer: BC Managed Care – PPO | Admitting: Family Medicine

## 2022-08-09 ENCOUNTER — Encounter: Payer: Self-pay | Admitting: Family Medicine

## 2022-08-09 ENCOUNTER — Other Ambulatory Visit (HOSPITAL_COMMUNITY): Payer: Self-pay

## 2022-08-09 VITALS — BP 112/76 | HR 82 | Temp 97.8°F | Ht 76.0 in | Wt 318.1 lb

## 2022-08-09 DIAGNOSIS — E669 Obesity, unspecified: Secondary | ICD-10-CM

## 2022-08-09 HISTORY — DX: Obesity, unspecified: E66.9

## 2022-08-09 MED ORDER — PHENTERMINE HCL 37.5 MG PO CAPS
37.5000 mg | ORAL_CAPSULE | ORAL | 0 refills | Status: DC
  Filled 2022-08-09: qty 30, 30d supply, fill #0

## 2022-08-09 MED ORDER — PHENTERMINE HCL 37.5 MG PO CAPS
37.5000 mg | ORAL_CAPSULE | ORAL | 0 refills | Status: DC
  Filled 2022-09-15: qty 30, 30d supply, fill #0

## 2022-08-09 NOTE — Patient Instructions (Signed)
Strong work losing weight.   Keep the diet clean and stay active.  Let us know if you need anything.

## 2022-08-09 NOTE — Progress Notes (Signed)
Chief Complaint  Patient presents with   Follow-up    1 month Phentermine    Subjective: Patient is a 22 y.o. male here for f/u.  Has been phentermine 37.5 mg/d. Reports compliance. Makes him irritable and sometimes affects sleep. Mild overall. He is lifting weights and doing some cardio.   Past Medical History:  Diagnosis Date   Abscess    Allergy     Objective: BP 112/76 (BP Location: Right Arm, Patient Position: Sitting, Cuff Size: Large)   Pulse 82   Temp 97.8 F (36.6 C) (Oral)   Ht 6\' 4"  (1.93 m)   Wt (!) 318 lb 2 oz (144.3 kg)   SpO2 96%   BMI 38.72 kg/m  General: Awake, appears stated age Heart: RRR, no LE edema Lungs: CTAB, no rales, wheezes or rhonchi. No accessory muscle use Psych: Age appropriate judgment and insight, normal affect and mood  Assessment and Plan: Obesity (BMI 30-39.9) - Plan: phentermine 37.5 MG capsule, phentermine 37.5 MG capsule  Chronic, improving continue 60 more days of phentermine. Counseled on diet/exercise. F/u in 4 mo for CPE.  The patient voiced understanding and agreement to the plan.  [place], DO 08/09/22  11:10 AM

## 2022-08-10 ENCOUNTER — Emergency Department (HOSPITAL_BASED_OUTPATIENT_CLINIC_OR_DEPARTMENT_OTHER)
Admission: EM | Admit: 2022-08-10 | Discharge: 2022-08-11 | Disposition: A | Discharge status: Home / Self Care | Payer: BC Managed Care – PPO | Attending: Emergency Medicine | Admitting: Emergency Medicine

## 2022-08-10 ENCOUNTER — Other Ambulatory Visit: Payer: Self-pay

## 2022-08-10 DIAGNOSIS — K6289 Other specified diseases of anus and rectum: Secondary | ICD-10-CM | POA: Diagnosis not present

## 2022-08-10 DIAGNOSIS — R1013 Epigastric pain: Secondary | ICD-10-CM | POA: Diagnosis not present

## 2022-08-10 LAB — COMPREHENSIVE METABOLIC PANEL
ALT: 51 U/L — ABNORMAL HIGH (ref 0–44)
AST: 25 U/L (ref 15–41)
Albumin: 4 g/dL (ref 3.5–5.0)
Alkaline Phosphatase: 33 U/L — ABNORMAL LOW (ref 38–126)
Anion gap: 5 (ref 5–15)
BUN: 17 mg/dL (ref 6–20)
CO2: 24 mmol/L (ref 22–32)
Calcium: 8.9 mg/dL (ref 8.9–10.3)
Chloride: 106 mmol/L (ref 98–111)
Creatinine, Ser: 0.92 mg/dL (ref 0.61–1.24)
GFR, Estimated: >60 mL/min (ref >60)
Glucose, Bld: 103 mg/dL — ABNORMAL HIGH (ref 70–99)
Potassium: 3.7 mmol/L (ref 3.5–5.1)
Sodium: 135 mmol/L (ref 135–145)
Total Bilirubin: 0.7 mg/dL (ref 0.3–1.2)
Total Protein: 7.7 g/dL (ref 6.5–8.1)

## 2022-08-10 LAB — URINALYSIS, ROUTINE W REFLEX MICROSCOPIC
Glucose, UA: NEGATIVE mg/dL
Hgb urine dipstick: NEGATIVE
Ketones, ur: NEGATIVE mg/dL
Leukocytes,Ua: NEGATIVE
Nitrite: NEGATIVE
Protein, ur: 30 mg/dL — AB
Specific Gravity, Urine: >=1.03 (ref 1.005–1.030)
pH: 5.5 (ref 5.0–8.0)

## 2022-08-10 LAB — CBC
HCT: 43.1 % (ref 39.0–52.0)
Hemoglobin: 14.4 g/dL (ref 13.0–17.0)
MCH: 28.8 pg (ref 26.0–34.0)
MCHC: 33.4 g/dL (ref 30.0–36.0)
MCV: 86.2 fL (ref 80.0–100.0)
Platelets: 261 10*3/uL (ref 150–400)
RBC: 5 MIL/uL (ref 4.22–5.81)
RDW: 13.7 % (ref 11.5–15.5)
WBC: 7.9 10*3/uL (ref 4.0–10.5)
nRBC: 0 % (ref 0.0–0.2)

## 2022-08-10 LAB — URINALYSIS, MICROSCOPIC (REFLEX): WBC, UA: NONE SEEN WBC/hpf (ref 0–5)

## 2022-08-10 LAB — LIPASE, BLOOD: Lipase: 40 U/L (ref 11–51)

## 2022-08-10 NOTE — ED Triage Notes (Signed)
Pt reports middle/epigastric abdominal pain for the last three days. Pt also reports rectal bleeding and pain with hx of hemorrhoids. Pt reports starting phentermine 2 weeks ago.

## 2022-08-11 NOTE — ED Provider Notes (Signed)
[facility]  Provider Note  CSN: [id number] Arrival date & time: 08/10/22 2220  History Chief Complaint  Patient presents with   Abdominal Pain   Rectal Pain    Donald Brooks is a 22 y.o. male here with two complaints, first is rectal pain. On going for several days after having a large hard BM, has had soft stools since then but persistent rectal pain and occasional blood on stools. He reports using multiple hemorrhoid remedies without improvement. Also having intermittent aching, epigastric pain, 'like he'd been punched in the gut'. No particular provoking or relieving factors. No nausea or vomiting. No fever. He was recently started on phentermine for weight loss.    Home Medications Prior to Admission medications   Medication Sig Start Date End Date Taking? Authorizing Provider  diazepam (VALIUM) 5 MG tablet Take 1 tablet by mouth 30-60 minutes prior to flights. 12/09/21   Shelda Pal, DO  phentermine 37.5 MG capsule Take 1 capsule by mouth every morning. 08/09/22   Shelda Pal, DO  phentermine 37.5 MG capsule Take 1 capsule (37.5 mg total) by mouth every morning. 09/08/22   Shelda Pal, DO     Allergies    Sulfa antibiotics and Sulfamethoxazole   Review of Systems   Review of Systems Please see HPI for pertinent positives and negatives  Physical Exam BP 130/80   Pulse 84   Temp 98.9 F (37.2 C) (Oral)   Resp 18   SpO2 98%   Physical Exam Vitals and nursing note reviewed.  Constitutional:      Appearance: Normal appearance.  HENT:     Head: Normocephalic and atraumatic.     Nose: Nose normal.     Mouth/Throat:     Mouth: Mucous membranes are moist.  Eyes:     Extraocular Movements: Extraocular movements intact.     Conjunctiva/sclera: Conjunctivae normal.  Cardiovascular:     Rate and Rhythm: Normal rate.  Pulmonary:     Effort: Pulmonary effort is normal.     Breath sounds:  Normal breath sounds.  Abdominal:     General: Abdomen is flat.     Palpations: Abdomen is soft.     Tenderness: There is abdominal tenderness (mild) in the epigastric area. There is no guarding. Negative signs include Murphy's sign and McBurney's sign.  Genitourinary:    Comments: Chaperone present, normal rectum externally, no hemorrhoid, fissure, bleeding or swelling.He refused DRE Musculoskeletal:        General: No swelling. Normal range of motion.     Cervical back: Neck supple.  Skin:    General: Skin is warm and dry.  Neurological:     General: No focal deficit present.     Mental Status: He is alert.  Psychiatric:        Mood and Affect: Mood normal.     ED Results / Procedures / Treatments   EKG None  Procedures Procedures  Medications Ordered in the ED Medications - No data to display  Initial Impression and Plan  Patient here with intermittent epigastric pain and rectal pain. Seems to be two different problems. His abdomen is benign, labs done in triage show normal CBC, CMP, Lipase and UA. Possible this is a side effect of phentermine, or recent use of NSAIDs. He also has rectal pain after a large hard stool last week. Possible he has a fissure, Low likelihood of abscess but would not allow detailed rectal exam beyond  looking externally. Advised to use anusol instead of hemorrhoid creams. Recommend he follow up with his PCP for further evaluation. RTED for any other concerns.   ED Course       MDM Rules/Calculators/A&P Medical Decision Making Problems Addressed: Epigastric pain: acute illness or injury Rectal pain: acute illness or injury  Amount and/or Complexity of Data Reviewed Labs: ordered. Decision-making details documented in ED Course.  Risk OTC drugs.     Final Clinical Impression(s) / ED Diagnoses Final diagnoses:  Epigastric pain  Rectal pain    Rx / DC Orders ED Discharge Orders     None        Truddie Hidden, MD 08/11/22  [phone]

## 2022-08-11 NOTE — ED Notes (Signed)
Discharge paperwork reviewed entirely with patient, including Rx's and follow up care. Pain was under control. Pt verbalized understanding as well as all parties involved. No questions or concerns voiced at the time of discharge. No acute distress noted.   Pt ambulated out to PVA without incident or assistance.  

## 2022-08-30 ENCOUNTER — Encounter: Payer: Self-pay | Admitting: Family Medicine

## 2022-09-15 ENCOUNTER — Other Ambulatory Visit (HOSPITAL_COMMUNITY): Payer: Self-pay

## 2022-09-20 ENCOUNTER — Encounter: Payer: Self-pay | Admitting: Family Medicine

## 2022-09-21 ENCOUNTER — Other Ambulatory Visit: Payer: Self-pay | Admitting: Family Medicine

## 2022-09-21 ENCOUNTER — Emergency Department (HOSPITAL_COMMUNITY): Payer: BC Managed Care – PPO

## 2022-09-21 ENCOUNTER — Other Ambulatory Visit (HOSPITAL_COMMUNITY): Payer: Self-pay

## 2022-09-21 ENCOUNTER — Other Ambulatory Visit: Payer: Self-pay

## 2022-09-21 ENCOUNTER — Emergency Department (HOSPITAL_COMMUNITY)
Admission: EM | Admit: 2022-09-21 | Discharge: 2022-09-21 | Disposition: A | Discharge status: Home / Self Care | Payer: BC Managed Care – PPO | Attending: Emergency Medicine | Admitting: Emergency Medicine

## 2022-09-21 ENCOUNTER — Encounter (HOSPITAL_COMMUNITY): Payer: Self-pay

## 2022-09-21 DIAGNOSIS — F40243 Fear of flying: Secondary | ICD-10-CM

## 2022-09-21 DIAGNOSIS — K6289 Other specified diseases of anus and rectum: Secondary | ICD-10-CM

## 2022-09-21 DIAGNOSIS — K625 Hemorrhage of anus and rectum: Secondary | ICD-10-CM | POA: Diagnosis present

## 2022-09-21 DIAGNOSIS — R1031 Right lower quadrant pain: Secondary | ICD-10-CM | POA: Diagnosis not present

## 2022-09-21 LAB — CBC WITH DIFFERENTIAL/PLATELET
Abs Immature Granulocytes: 0.02 10*3/uL (ref 0.00–0.07)
Basophils Absolute: 0 10*3/uL (ref 0.0–0.1)
Basophils Relative: 1 %
Eosinophils Absolute: 0.1 10*3/uL (ref 0.0–0.5)
Eosinophils Relative: 1 %
HCT: 43.2 % (ref 39.0–52.0)
Hemoglobin: 14.5 g/dL (ref 13.0–17.0)
Immature Granulocytes: 0 %
Lymphocytes Relative: 26 %
Lymphs Abs: 2.3 10*3/uL (ref 0.7–4.0)
MCH: 28.9 pg (ref 26.0–34.0)
MCHC: 33.6 g/dL (ref 30.0–36.0)
MCV: 86.2 fL (ref 80.0–100.0)
Monocytes Absolute: 0.6 10*3/uL (ref 0.1–1.0)
Monocytes Relative: 7 %
Neutro Abs: 5.8 10*3/uL (ref 1.7–7.7)
Neutrophils Relative %: 65 %
Platelets: 264 10*3/uL (ref 150–400)
RBC: 5.01 MIL/uL (ref 4.22–5.81)
RDW: 13.6 % (ref 11.5–15.5)
WBC: 8.8 10*3/uL (ref 4.0–10.5)
nRBC: 0 % (ref 0.0–0.2)

## 2022-09-21 LAB — COMPREHENSIVE METABOLIC PANEL
ALT: 23 U/L (ref 0–44)
AST: 17 U/L (ref 15–41)
Albumin: 4 g/dL (ref 3.5–5.0)
Alkaline Phosphatase: 34 U/L — ABNORMAL LOW (ref 38–126)
Anion gap: 8 (ref 5–15)
BUN: 13 mg/dL (ref 6–20)
CO2: 26 mmol/L (ref 22–32)
Calcium: 8.9 mg/dL (ref 8.9–10.3)
Chloride: 102 mmol/L (ref 98–111)
Creatinine, Ser: 0.91 mg/dL (ref 0.61–1.24)
GFR, Estimated: >60 mL/min (ref >60)
Glucose, Bld: 92 mg/dL (ref 70–99)
Potassium: 3.5 mmol/L (ref 3.5–5.1)
Sodium: 136 mmol/L (ref 135–145)
Total Bilirubin: 0.9 mg/dL (ref 0.3–1.2)
Total Protein: 7.6 g/dL (ref 6.5–8.1)

## 2022-09-21 LAB — OCCULT BLOOD X 1 CARD TO LAB, STOOL: Fecal Occult Bld: POSITIVE — AB

## 2022-09-21 LAB — LIPASE, BLOOD: Lipase: 29 U/L (ref 11–51)

## 2022-09-21 MED ORDER — DIAZEPAM 5 MG PO TABS
ORAL_TABLET | ORAL | 0 refills | Status: DC
  Filled 2022-09-21: qty 10, 10d supply, fill #0

## 2022-09-21 MED ORDER — IOHEXOL 300 MG/ML  SOLN
100.0000 mL | Freq: Once | INTRAMUSCULAR | Status: AC | PRN
  Administered 2022-09-21: 100 mL via INTRAVENOUS

## 2022-09-21 MED ORDER — MORPHINE SULFATE (PF) 4 MG/ML IV SOLN
4.0000 mg | Freq: Once | INTRAVENOUS | Status: AC
  Administered 2022-09-21: 4 mg via INTRAVENOUS
  Filled 2022-09-21: qty 1

## 2022-09-21 NOTE — ED Provider Notes (Signed)
[place] EM[facility] DEPARTMENT AT [facility] Provider Note   CSN: [id number] Arrival date & time: 09/21/22  1815     History  Chief Complaint  Patient presents with   Rectal Pain    Kale Gillingham is a 22 y.o. male with a past medical history of obesity presenting today with concern of rectal pain and bleeding.  He reports yesterday morning he had a bowel movement and afterward had bright red blood coming out of his rectum.  There is no blood in the stool.  No thinners or history of GI bleed.  No family history of IBS/IBD.  Says that ever since then he has been having some dull pain in his rectum however occasionally he will have sharp pain that lasts for 30 minutes and he is unable to move.  No nausea, vomiting, diarrhea or constipation.  Reports that he has normal bowel movements and occasionally takes a stool softener.  Denies foreign bodies used in the rectum and anal receptive intercourse HPI     Home Medications Prior to Admission medications   Medication Sig Start Date End Date Taking? Authorizing Provider  diazepam (VALIUM) 5 MG tablet Take 1 tablet by mouth 30-60 minutes prior to flights. 09/21/22   Sharlene Dory, DO  phentermine 37.5 MG capsule Take 1 capsule by mouth every morning. 08/09/22   Sharlene Dory, DO  phentermine 37.5 MG capsule Take 1 capsule (37.5 mg total) by mouth every morning. 09/08/22   Sharlene Dory, DO      Allergies    Sulfa antibiotics and Sulfamethoxazole    Review of Systems   Review of Systems  Physical Exam Updated Vital Signs BP (!) 146/93 (BP Location: Right Arm)   Pulse 96   Temp 98.8 F (37.1 C) (Oral)   Resp 20   Ht 6\' 4"  (1.93 m)   Wt 136.1 kg   SpO2 100%   BMI 36.52 kg/m  Physical Exam Vitals and nursing note reviewed.  Constitutional:      General: He is not in acute distress.    Appearance: Normal appearance. He is not ill-appearing.  HENT:     Head: Normocephalic and atraumatic.   Eyes:     General: No scleral icterus.    Conjunctiva/sclera: Conjunctivae normal.  Pulmonary:     Effort: Pulmonary effort is normal. No respiratory distress.  Abdominal:     General: Abdomen is flat.     Palpations: Abdomen is soft.     Tenderness: There is abdominal tenderness (Right lower quadrant).  Genitourinary:    Comments: Rectal exam performed in presence of nurse tech.  Patient had no external nor internal hemorrhoids.  No obvious fissures.  Tan stool Skin:    Findings: No rash.  Neurological:     Mental Status: He is alert.  Psychiatric:        Mood and Affect: Mood normal.     ED Results / Procedures / Treatments   Labs (all labs ordered are listed, but only abnormal results are displayed) Labs Reviewed - No data to display  EKG None  Radiology CT ABDOMEN PELVIS W CONTRAST  Result Date: 09/21/2022 CLINICAL DATA:  Rectal bleeding and pain. EXAM: CT ABDOMEN AND PELVIS WITH CONTRAST TECHNIQUE: Multidetector CT imaging of the abdomen and pelvis was performed using the standard protocol following bolus administration of intravenous contrast. RADIATION DOSE REDUCTION: This exam was performed according to the departmental dose-optimization program which includes automated exposure control, adjustment of the mA and/or  kV according to patient size and/or use of iterative reconstruction technique. CONTRAST:  OMNIPAQUE IOHEXOL 300 MG/ML  SOLN COMPARISON:  CT abdomen and pelvis 12/13/2018 FINDINGS: Lower chest: No acute abnormality. There is a 2 mm nodular density in the lingula image 5/8. Hepatobiliary: No focal liver abnormality is seen. No gallstones, gallbladder wall thickening, or biliary dilatation. Pancreas: Unremarkable. No pancreatic ductal dilatation or surrounding inflammatory changes. Spleen: Normal in size without focal abnormality. Adrenals/Urinary Tract: Adrenal glands are unremarkable. Kidneys are normal, without renal calculi, focal lesion, or hydronephrosis.  Bladder is unremarkable. Stomach/Bowel: Stomach is within normal limits. Appendix appears normal. No evidence of bowel wall thickening, distention, or inflammatory changes. Vascular/Lymphatic: No significant vascular findings are present. No enlarged abdominal or pelvic lymph nodes. Reproductive: Prostate is unremarkable. Other: No abdominal wall hernia or abnormality. No abdominopelvic ascites. Musculoskeletal: No acute or significant osseous findings. IMPRESSION: 1. No acute localizing process in the abdomen or pelvis. 2. 2 mm nodular density in the lingula. No follow-up needed if patient is low-risk.This recommendation follows the consensus statement: Guidelines for Management of Incidental Pulmonary Nodules Detected on CT Images: From the Fleischner Society 2017; Radiology 2017; 284:228-243. Electronically Signed   By: Darliss Cheney M.D.   On: 09/21/2022 21:02    Procedures Procedures   Medications Ordered in ED Medications  morphine (PF) 4 MG/ML injection 4 mg (4 mg Intravenous Given 09/21/22 2001)  iohexol (OMNIPAQUE) 300 MG/ML solution 100 mL (100 mLs Intravenous Contrast Given 09/21/22 2040)    ED Course/ Medical Decision Making/ A&P                             Medical Decision Making Amount and/or Complexity of Data Reviewed Labs: ordered. Radiology: ordered.  Risk Prescription drug management.   22 year old presenting today with rectal pain and bleeding.  Differential includes but is not limited to to fecal impaction, hemorrhoids, fissure, fistula, ulcerative colitis and diverticulitis.  This is not an exhaustive differential.    Past Medical History / Co-morbidities / Social History: Obesity and now on Wegovy   Additional history: Per chart review patient had a normal CT in 2020  Patient seen 3/20 for rectal pain and epigastric pain.  Refused digital rectal exam at that time   Physical Exam: Pertinent physical exam findings include Right lower quadrant tenderness and  guarding Tan stool however occult +  Lab Tests: I ordered, and personally interpreted labs.  The pertinent results include: + occult Normal hgb   Imaging Studies: I ordered and independently visualized and interpreted CT and I agree with the radiologist that there are no acute findings in the abdomen.  There was an incidental lung nodule finding    Medications: I ordered medication including morphine. Reevaluation of the patient after these medicines showed that the patient improved. I have reviewed the patients home medicines and have made adjustments as needed.    MDM/Disposition: This is a 22 year old male presenting today with an episode of bright red blood per rectum yesterday morning.  He has been having rectal pain ever since.  He said that the blood was not mixed in the stool but came out of his rectum after having a bowel movement.  Physical exam today shows no signs of external or internal hemorrhoids.  Hemoccult was positive.  Hemoglobin was stable however I still did consider consultation with GI and admission however patient has stable vital signs and is not anemic.  He is young  and healthy and is a good candidate for following up outpatient for GI bleeding.  Counseled to stop any NSAID medications, avoid alcohol and make an appointment with gastroenterology.  I placed a formal referral to the [facility] group however the patient and his mother said they may be would prefer to see Deboraha Sprang so their number was on their discharge papers as well.  Agreeable to discharge.   Of note, I did make them aware of the nodule and reassured that this is not something to be concerned about and it does not require any acute follow-up   Final Clinical Impression(s) / ED Diagnoses Final diagnoses:  Rectal pain  Rectal bleeding    Rx / DC Orders ED Discharge Orders          Ordered    Ambulatory referral to Gastroenterology        09/21/22 1950           Results and diagnoses were  explained to the patient. Return precautions discussed in full. Patient had no additional questions and expressed complete understanding.   This chart was dictated using voice recognition software.  Despite best efforts to proofread,  errors can occur which can change the documentation meaning.     Saddie Benders, PA-C 09/21/22 2135    Melene Plan, DO 09/21/22 2142

## 2022-09-21 NOTE — Discharge Instructions (Addendum)
Your stool sample did detect blood.  As we discussed, you will need to make an appointment with gastroenterology.  You will likely receive a phone call from [place] in the next couple of days however I have attached [facility] to these papers for you to call if that is your preference.  Avoid medications such as ibuprofen, naproxen, aspirin or any other NSAIDs.  Also avoid any alcohol.   Please return to the emergency department with any worsening symptoms.  It was a pleasure to to take care of you and we hope you feel better.  Please make your follow-up appointment!

## 2022-09-21 NOTE — ED Triage Notes (Signed)
Pt states he had rectal bleeding yesterday when using the bathroom, bright red blood that lasted 5-10 minutes. No further episodes of bleeding. Pt has intermittent rectal pain that he states is the worst pain he has ever had.

## 2022-09-23 ENCOUNTER — Encounter: Payer: Self-pay | Admitting: Physician Assistant

## 2022-09-23 ENCOUNTER — Encounter: Payer: Self-pay | Admitting: Family Medicine

## 2022-09-24 ENCOUNTER — Ambulatory Visit: Payer: BC Managed Care – PPO | Admitting: Family Medicine

## 2022-09-24 ENCOUNTER — Encounter: Payer: Self-pay | Admitting: Family Medicine

## 2022-09-24 VITALS — BP 126/64 | HR 90 | Ht 76.0 in | Wt 304.0 lb

## 2022-09-24 DIAGNOSIS — K6289 Other specified diseases of anus and rectum: Secondary | ICD-10-CM | POA: Diagnosis not present

## 2022-09-24 DIAGNOSIS — R195 Other fecal abnormalities: Secondary | ICD-10-CM | POA: Diagnosis not present

## 2022-09-24 NOTE — Progress Notes (Signed)
Acute Office Visit  Subjective:     Patient ID: Donald Brooks, male    DOB: 12-14-2000, 22 y.o.   MRN: [id number]  Chief Complaint  Patient presents with   Rectal Pain    HPI Patient is in today for ED follow-up.  He went to the ED on 09/21/22 with rectal pain and bleeding (bright blood after a BM). Pain was mostly dull, but would occasionally get sharp pains lasting for about 30 minutes. He denied nausea, vomiting, diarrhea, constipation, use of foreign bodies or anal receptive intercourse. He did have RLQ pain on exam and + occult stool. CT abdomen with no acute GI findings (incidental lung nodule found - no follow-up indicated if low risk) and CBC/CMP stable. No hemorrhoids noted on exam. He was encouraged to stop NSAID/alcohol and follow-up outpatient with GI.   Today he reports that there is no improvement leaving the ED. He has not had a BM in 3 days now (previously BMs were 3-4 per day). Still not having any abdominal pain. He has taken Dulcolax x2, apple juice with no improvement. No recent diet changes. Several months ago he was on [facility] for about a year (he has lost ~100 pounds now), but now on phentermine. He and mom are very concerned about the rectal pain. States he has noticed it daily for the past month, gradually getting worse with frequent "episodes."  Pain is constant (dull, rectal pain 3/10), but has "episodes" at least 3 times per day: timing of day is random, no association with diet; sudden sharp pain (stabbing in rectum, feels like glass, 10/10), associated with nausea, clammy feeling, shaky, lasting for about 30 minutes. Does not improve with anything specific. Not associated with bowel movements  No urinary symptom or fevers      ROS All review of systems negative except what is listed in the HPI      Objective:    BP 126/64   Pulse 90   Ht 6\' 4"  (1.93 m)   Wt (!) 304 lb (137.9 kg)   SpO2 98%   BMI 37.00 kg/m    Physical Exam Vitals reviewed.   Constitutional:      General: He is not in acute distress.    Appearance: Normal appearance. He is obese. He is not ill-appearing.  Cardiovascular:     Rate and Rhythm: Normal rate and regular rhythm.  Pulmonary:     Effort: Pulmonary effort is normal.     Breath sounds: Normal breath sounds.  Abdominal:     General: Bowel sounds are normal. There is no distension.     Palpations: Abdomen is soft. There is no mass.     Tenderness: There is no abdominal tenderness. There is no guarding or rebound.     Hernia: No hernia is present.  Genitourinary:    Comments: Declined exam  Skin:    General: Skin is warm and dry.  Neurological:     Mental Status: He is alert and oriented to person, place, and time.  Psychiatric:        Mood and Affect: Mood normal.        Behavior: Behavior normal.        Thought Content: Thought content normal.        Judgment: Judgment normal.     No results found for any visits on 09/24/22.      Assessment & Plan:   Problem List Items Addressed This Visit   None Visit Diagnoses  Occult blood positive stool    -  Primary   Relevant Orders   Ambulatory referral to Gastroenterology   Rectal pain       Relevant Orders   Ambulatory referral to Gastroenterology      Discussed constipation measures for current episode - Miralax, Metamucil or Benefiber, hydration, exercise, etc Given the frequent severe episodes of pain and recent occult+, will try for an urgent referral to get him evaluated No need for further imaging/labs at this time given stable ED workup earlier this week   No orders of the defined types were placed in this encounter.   Return if symptoms worsen or fail to improve.  Clayborne Dana, NP

## 2022-09-24 NOTE — Patient Instructions (Addendum)
Discussed constipation measures for current episode - Miralax, Metamucil or Benefiber, hydration, exercise, etc Given the frequent severe episodes and blood in stool, will try for an urgent referral to get him evaluated No need for further imaging/labs at this time given stable ED workup earlier this week

## 2022-09-29 ENCOUNTER — Ambulatory Visit: Payer: BC Managed Care – PPO | Admitting: Family Medicine

## 2022-11-22 ENCOUNTER — Telehealth: Payer: Self-pay

## 2022-11-22 NOTE — Telephone Encounter (Signed)
Initial Comment Caller states he was transferred for triage. He is having dizziness upon standing and elevated heart rate. He wants to confirm his appt for tomorrow. Translation No Nurse Assessment Nurse: Hassan Rowan, RN, Melissa Date/Time (Eastern Time): 11/22/2022 3:16:07 PM Confirm and document reason for call. If symptomatic, describe symptoms. ---caller states dizziness and high HR upon standing, wants to confirm MD appt for tomorrow, states has been happening for a while, more frequent in the past few weeks, states happening everytime he goes from laying to standing or sitting to standing, states HR getting up to 140 per pulse ox, states not taking BP when this happens Does the patient have any new or worsening symptoms? ---Yes Will a triage be completed? ---Yes Related visit to physician within the last 2 weeks? ---No Does the PT have any chronic conditions? (i.e. diabetes, asthma, this includes High risk factors for pregnancy, etc.) ---No Is this a behavioral health or substance abuse call? ---No Guidelines Guideline Title Affirmed Question Affirmed Notes Nurse Date/Time (Eastern Time) Dizziness - Lightheadedness [1] MODERATE dizziness (e.g., interferes with normal activities) AND [2] has NOT been evaluated by Hassan Rowan, RN, Melissa 11/22/2022 3:18:56 PM PLEASE NOTE: All timestamps contained within this report are represented as [place] Standard Time. CONFIDENTIALTY NOTICE: This fax transmission is intended only for the addressee. It contains information that is legally privileged, confidential or otherwise protected from use or disclosure. If you are not the intended recipient, you are strictly prohibited from reviewing, disclosing, copying using or disseminating any of this information or taking any action in reliance on or regarding this information. If you have received this fax in error, please notify us immediately by telephone so that we can arrange for its return to [place].  Phone: [phone], Toll-Free: [phone], Fax: [phone] Page: 2 of 2 Call Id: [id number] Guidelines Guideline Title Affirmed Question Affirmed Notes Nurse Date/Time Lamount Cohen Time) doctor (or NP/PA) for this (Exception: Dizziness caused by heat exposure, sudden standing, or poor fluid intake.) Disp. Time Lamount Cohen Time) Disposition Final User 11/22/2022 3:24:41 PM See PCP within 24 Hours Yes Camery, RN, Melissa Final Disposition 11/22/2022 3:24:41 PM See PCP within 24 Hours Yes Camery, RN, Efraim Kaufmann Caller Disagree/Comply Comply Caller Understands Yes PreDisposition Call a family member Care Advice Given Per Guideline SEE PCP WITHIN 24 HOURS: * IF OFFICE WILL BE OPEN: You need to be examined within the next 24 hours. Call your doctor (or NP/PA) when the office opens and make an appointment. CALL BACK IF: * Passes out (faints) * You become worse Comments User: Lauris Poag, RN Date/Time (Eastern Time): 11/22/2022 3:20:44 PM pt went to retrieve pulse ox User: Lauris Poag, RN Date/Time Lamount Cohen Time): 11/22/2022 3:21:28 PM pulse 94 now User: Lauris Poag, RN Date/Time Lamount Cohen Time): 11/22/2022 3:21:47 PM denies chest pain or SOB User: Lauris Poag, RN Date/Time Lamount Cohen Time): 11/22/2022 3:29:30 PM per Shanda Bumps with office pt has appt tomorrow at 1130 Referrals REFERRED TO PCP OFFICE

## 2022-11-23 ENCOUNTER — Ambulatory Visit: Payer: BC Managed Care – PPO | Admitting: Family Medicine

## 2022-11-23 VITALS — BP 128/80 | HR 88 | Temp 98.0°F | Ht 76.0 in | Wt 290.0 lb

## 2022-11-23 DIAGNOSIS — R55 Syncope and collapse: Secondary | ICD-10-CM | POA: Diagnosis not present

## 2022-11-23 DIAGNOSIS — R42 Dizziness and giddiness: Secondary | ICD-10-CM | POA: Diagnosis not present

## 2022-11-23 LAB — COMPREHENSIVE METABOLIC PANEL
ALT: 20 U/L (ref 0–53)
AST: 14 U/L (ref 0–37)
Albumin: 4.4 g/dL (ref 3.5–5.2)
Alkaline Phosphatase: 35 U/L — ABNORMAL LOW (ref 39–117)
BUN: 16 mg/dL (ref 6–23)
CO2: 30 mEq/L (ref 19–32)
Calcium: 10 mg/dL (ref 8.4–10.5)
Chloride: 100 mEq/L (ref 96–112)
Creatinine, Ser: 0.9 mg/dL (ref 0.40–1.50)
GFR: 121.77 mL/min (ref >60.00)
Glucose, Bld: 71 mg/dL (ref 70–99)
Potassium: 4.4 mEq/L (ref 3.5–5.1)
Sodium: 138 mEq/L (ref 135–145)
Total Bilirubin: 0.6 mg/dL (ref 0.2–1.2)
Total Protein: 7.6 g/dL (ref 6.0–8.3)

## 2022-11-23 LAB — CBC
HCT: 45.6 % (ref 39.0–52.0)
Hemoglobin: 14.9 g/dL (ref 13.0–17.0)
MCHC: 32.6 g/dL (ref 30.0–36.0)
MCV: 87.6 fl (ref 78.0–100.0)
Platelets: 295 10*3/uL (ref 150.0–400.0)
RBC: 5.2 Mil/uL (ref 4.22–5.81)
RDW: 14.4 % (ref 11.5–15.5)
WBC: 7.3 10*3/uL (ref 4.0–10.5)

## 2022-11-23 NOTE — Patient Instructions (Addendum)
Stay hydrated.  Consider adding salt to your savory foods.   Consider a sports drink daily.   Consider compression stockings.  Consider intense recumbent exercise.  Give [place] 2-3 business days to get the results of your labs back.   If you do not hear anything about your referral in the next 1-2 weeks, call our office and ask for an update.  Let us know if you need anything.

## 2022-11-23 NOTE — Progress Notes (Signed)
Chief Complaint  Patient presents with   Dizziness    When standing up    Donald Brooks is 22 y.o. pt here for dizziness.  Duration: 1 year Pass out? Yes Spinning? No Recent illness/fever? No Headache? No Neurologic signs? No Change in PO intake? No- started around a year ago after his first dose of Wegovy; he is not taking this anymore. His mother told him to wear a pulse ox and he stood up and his pulse went to 140. He stays very well-hydrated.  He is continuing to lose weight.  Past Medical History:  Diagnosis Date   Abscess    Allergy     Family History  Problem Relation Age of Onset   Diabetes Mother    Hyperlipidemia Mother    Hypothyroidism Mother    Diabetes Father    Hypertension Father    Birth defects Son    Cancer Maternal Grandmother        thyroid   Sudden death Neg Hx    Heart attack Neg Hx     Allergies as of 11/23/2022       Reactions   Sulfa Antibiotics Swelling, Rash   Sulfamethoxazole Rash        Medication List        Accurate as of November 23, 2022 12:10 PM. If you have any questions, ask your nurse or doctor.          STOP taking these medications    phentermine 37.5 MG capsule Stopped by: Sharlene Dory, DO       TAKE these medications    Anusol-HC 2.5 % rectal cream Generic drug: hydrocortisone Place 1 Application rectally 2 (two) times daily as needed for hemorrhoids or anal itching.   diazepam 5 MG tablet Commonly known as: Valium Take 1 tablet by mouth 30-60 minutes prior to flights.        BP 128/80   Pulse 88   Temp 98 F (36.7 C) (Oral)   Ht 6\' 4"  (1.93 m)   Wt 290 lb (131.5 kg)   SpO2 99%   BMI 35.30 kg/m  General: Awake, alert, appears stated age Eyes: PERRLA, EOMi Ears: Patent, TM's neg b/l Heart: RRR, no murmurs, no carotid bruits Lungs: CTAB, no accessory muscle use MSK: 5/5 strength throughout, gait normal Neuro: No cerebellar signs, patellar reflex 2/4 b/l wo clonus, calcaneal reflex  0/4 b/l wo clonus, biceps reflex 1/4 b/l wo clonus; Dix-Hall-Pike negative bilaterally. Psych: Age appropriate judgment and insight, normal mood and affect  Syncope, unspecified syncope type - Plan: CBC, Comprehensive metabolic panel, Ambulatory referral to Cardiology  Lightheadedness - Plan: CBC, Comprehensive metabolic panel, Ambulatory referral to Cardiology  Check labs, refer to cardiology as he probably needs an echo and perhaps other testing.  Decided to hold off on an EKG for now.  Salt supplementation, compression stockings, adequate hydration, and aggressively, and exercise recommended. F/u prn. Pt voiced understanding and agreement to the plan.  Jilda Roche [place]as, DO 11/23/22 12:10 PM

## 2022-11-25 ENCOUNTER — Encounter: Payer: Self-pay | Admitting: Cardiology

## 2022-11-29 NOTE — Progress Notes (Deleted)
11/29/2022 Donald Brooks [id number] December 27, 2000  Referring provider: Sharlene Dory* Primary GI doctor: {acdocs:27040}  ASSESSMENT AND PLAN:   There are no diagnoses linked to this encounter.   Patient Care Team: Sharlene Dory, DO as PCP - General (Family Medicine)  HISTORY OF PRESENT ILLNESS: 22 y.o. male with a past medical history of obesity, nausea, diarrhea and others listed below presents for evaluation of rectal pain and bleeding.  09/21/2022 ER visit for rectal pain and bleeding positive FOBT. CT abdomen pelvis no acute GI findings no anemia, leukocytosis, normal kidney liver. Ensure stop NSAIDs and alcohol here for follow-up. Patient was on [facility] for several months lost about 100 pounds currently on phentermine..   Patient {Actions; denies-reports:120008} family history of colon cancer or other gastrointestinal malignancies.   He {Actions; denies-reports:120008} blood thinner use.  He {Actions; denies-reports:120008} NSAID use.  He {Actions; denies-reports:120008} ETOH use.   He {Actions; denies-reports:120008} tobacco use.  He {Actions; denies-reports:120008} drug use.    He  reports that he has never smoked. He has never used smokeless tobacco. He reports that he does not drink alcohol and does not use drugs.  RELEVANT LABS AND IMAGING: CBC    Component Value Date/Time   WBC 7.3 11/23/2022 1148   RBC 5.20 11/23/2022 1148   HGB 14.9 11/23/2022 1148   HGB 13.7 04/27/2017 1749   HCT 45.6 11/23/2022 1148   HCT 40.5 04/27/2017 1749   PLT 295.0 11/23/2022 1148   PLT 337 04/27/2017 1749   MCV 87.6 11/23/2022 1148   MCV 82 04/27/2017 1749   MCH 28.9 09/21/2022 1901   MCHC 32.6 11/23/2022 1148   RDW 14.4 11/23/2022 1148   RDW 15.3 04/27/2017 1749   LYMPHSABS 2.3 09/21/2022 1901   LYMPHSABS 3.0 04/27/2017 1749   MONOABS 0.6 09/21/2022 1901   EOSABS 0.1 09/21/2022 1901   EOSABS 0.1 04/27/2017 1749   BASOSABS 0.0 09/21/2022 1901    BASOSABS 0.0 04/27/2017 1749   Recent Labs    12/09/21 1317 08/10/22 2239 09/21/22 1901 11/23/22 1148  HGB 15.1 14.4 14.5 14.9    CMP     Component Value Date/Time   NA 138 11/23/2022 1148   NA 139 04/27/2017 1749   K 4.4 11/23/2022 1148   CL 100 11/23/2022 1148   CO2 30 11/23/2022 1148   GLUCOSE 71 11/23/2022 1148   BUN 16 11/23/2022 1148   BUN 15 04/27/2017 1749   CREATININE 0.90 11/23/2022 1148   CREATININE 0.74 11/23/2017 1606   CALCIUM 10.0 11/23/2022 1148   PROT 7.6 11/23/2022 1148   PROT 7.8 04/27/2017 1749   ALBUMIN 4.4 11/23/2022 1148   ALBUMIN 4.5 04/27/2017 1749   AST 14 11/23/2022 1148   ALT 20 11/23/2022 1148   ALKPHOS 35 (L) 11/23/2022 1148   BILITOT 0.6 11/23/2022 1148   BILITOT 0.3 04/27/2017 1749   GFRNONAA >60 09/21/2022 1901   GFRAA NOT CALCULATED 12/14/2018 0340      Latest Ref Rng & Units 11/23/2022   11:48 AM 09/21/2022    7:01 PM 08/10/2022   10:39 PM  Hepatic Function  Total Protein 6.0 - 8.3 g/dL 7.6  7.6  7.7   Albumin 3.5 - 5.2 g/dL 4.4  4.0  4.0   AST 0 - 37 U/L 14  17  25    ALT 0 - 53 U/L 20  23  51   Alk Phosphatase 39 - 117 U/L 35  34  33   Total Bilirubin 0.2 -  1.2 mg/dL 0.6  0.9  0.7       Current Medications:        Current Outpatient Medications (Other):    diazepam (VALIUM) 5 MG tablet, Take 1 tablet by mouth 30-60 minutes prior to flights.   hydrocortisone (ANUSOL-HC) 2.5 % rectal cream, Place 1 Application rectally 2 (two) times daily as needed for hemorrhoids or anal itching.  Medical History:  Past Medical History:  Diagnosis Date   Abscess    Allergy    Allergies:  Allergies  Allergen Reactions   Sulfa Antibiotics Swelling and Rash   Sulfamethoxazole Rash     Surgical History:  He  has a past surgical history that includes Tonsillectomy and adenoidectomy; Hydrocele excision / repair; and Fracture surgery (Left). Family History:  His family history includes Birth defects in his son; Cancer in his  maternal grandmother; Diabetes in his father and mother; Hyperlipidemia in his mother; Hypertension in his father; Hypothyroidism in his mother.  REVIEW OF SYSTEMS  : All other systems reviewed and negative except where noted in the History of Present Illness.  PHYSICAL EXAM: There were no vitals taken for this visit. General Appearance: Well nourished, in no apparent distress. Head:   Normocephalic and atraumatic. Eyes:  sclerae anicteric,conjunctive pink  Respiratory: Respiratory effort normal, BS equal bilaterally without rales, rhonchi, wheezing. Cardio: RRR with no MRGs. Peripheral pulses intact.  Abdomen: Soft,  {BlankSingle:19197::"Flat","Obese","Non-distended"} ,active bowel sounds. {actendernessAB:27319} tenderness {anatomy; site abdomen:5010}. {BlankMultiple:19196::"Without guarding","With guarding","Without rebound","With rebound"}. No masses. Rectal: {acrectalexam:27461} Musculoskeletal: Full ROM, {PSY - GAIT AND STATION:22860} gait. {With/Without:304960234} edema. Skin:  Dry and intact without significant lesions or rashes Neuro: Alert and  oriented x4;  No focal deficits. Psych:  Cooperative. Normal mood and affect.    Doree Albee, PA-C 1:21 PM

## 2022-12-01 ENCOUNTER — Ambulatory Visit: Payer: BC Managed Care – PPO | Admitting: Physician Assistant

## 2022-12-07 ENCOUNTER — Ambulatory Visit: Payer: BC Managed Care – PPO | Admitting: Internal Medicine

## 2022-12-10 ENCOUNTER — Encounter: Payer: BC Managed Care – PPO | Admitting: Family Medicine

## 2022-12-13 ENCOUNTER — Encounter: Payer: BC Managed Care – PPO | Admitting: Family Medicine

## 2022-12-22 DIAGNOSIS — T7840XA Allergy, unspecified, initial encounter: Secondary | ICD-10-CM | POA: Insufficient documentation

## 2022-12-22 DIAGNOSIS — L0291 Cutaneous abscess, unspecified: Secondary | ICD-10-CM | POA: Insufficient documentation

## 2022-12-22 NOTE — Progress Notes (Deleted)
Cardiology Office Note:    Date:  12/22/2022   ID:  Donald Brooks, DOB 2001/03/19, MRN [id number]  PCP:  Sharlene Dory, DO  Cardiologist:  Norman Herrlich, MD   Referring MD: Sharlene Dory*  ASSESSMENT:    No diagnosis found. PLAN:    In order of problems listed above:  ***  Next appointment   Medication Adjustments/Labs and Tests Ordered: Current medicines are reviewed at length with the patient today.  Concerns regarding medicines are outlined above.  No orders of the defined types were placed in this encounter.  No orders of the defined types were placed in this encounter.    No chief complaint on file. ***  History of Present Illness:    Donald Brooks is a 22 y.o. male who is being seen today for the evaluation of *** at the request of Sharlene Dory*. He was seen by his primary care physician 11/23/2022 for episodic dizziness at the time he has taken for Enterra mean. He was seen by cardiology [facility] 11/30/2022 for orthostasis.  He had a recommendation for an echocardiogram and a 2-week heart rhythm monitor to screen for arrhythmia.  An EKG performed but unfortunately I cannot see it and it is not described in the office note.  A 30-day event monitor was applied 12/21/2022 Past Medical History:  Diagnosis Date   Abscess    Acanthosis nigricans 02/13/2018   Allergy    Chronic diarrhea 12/26/2019   Elevated triglycerides with high cholesterol 12/26/2019   Epistaxis 04/27/2017   Inattention 11/23/2017   Left leg pain 06/27/2018   Low HDL (under 40) 02/13/2018   Lower leg mass, left 11/23/2017   Nausea 12/26/2019   Obesity (BMI 30-39.9) 08/09/2022   Severe obesity due to excess calories without serious comorbidity with body mass index (BMI) greater than 99th percentile for age in pediatric patient (HCC) 11/23/2017    Past Surgical History:  Procedure Laterality Date   FRACTURE SURGERY Left    fracture left wrist 2006   HYDROCELE  EXCISION / REPAIR     TONSILLECTOMY AND ADENOIDECTOMY      Current Medications: No outpatient medications have been marked as taking for the 12/23/22 encounter (Appointment) with Baldo Daub, MD.     Allergies:   Sulfa antibiotics and Sulfamethoxazole   Social History   Socioeconomic History   Marital status: Single    Spouse name: Not on file   Number of children: Not on file   Years of education: Not on file   Highest education level: Some college, no degree  Occupational History    Comment: student  Tobacco Use   Smoking status: Never   Smokeless tobacco: Never  Vaping Use   Vaping status: Every Day   Substances: Nicotine  Substance and Sexual Activity   Alcohol use: No    Alcohol/week: 0.0 standard drinks of alcohol   Drug use: No   Sexual activity: Not Currently  Other Topics Concern   Not on file  Social History Narrative   Home with parents   Student at [employer]   Social Determinants of Health   Financial Resource Strain: Low Risk  (11/30/2022)   Received from [employer], [facility]   Overall Financial Resource Strain (CARDIA)    Difficulty of Paying Living Expenses: Not hard at all  Food Insecurity: No Food Insecurity (11/30/2022)   Received from [facility], [facility]   Hunger Vital Sign    Worried About Running Out of Food in  the Last Year: Never true    Ran Out of Food in the Last Year: Never true  Transportation Needs: No Transportation Needs (11/30/2022)   Received from [facility], [facility]   PRAPARE - Transportation    Lack of Transportation (Medical): No    Lack of Transportation (Non-Medical): No  Physical Activity: Sufficiently Active (11/23/2022)   Exercise Vital Sign    Days of Exercise per Week: 7 days    Minutes of Exercise per Session: 30 min  Stress: Not on file  Social Connections: Unknown (11/26/2022)   Received from [facility], [facility]   Social Network    Social Network: Not on file  Recent Concern: Social  Connections - Moderately Isolated (11/23/2022)   Social Connection and Isolation Panel [NHANES]    Frequency of Communication with Friends and Family: More than three times a week    Frequency of Social Gatherings with Friends and Family: More than three times a week    Attends Religious Services: More than 4 times per year    Active Member of [employer] or Organizations: No    Attends Engineer, structural: Not on file    Marital Status: Never married     Family History: The patient's ***family history includes Birth defects in his son; Cancer in his maternal grandmother; Diabetes in his father and mother; Hyperlipidemia in his mother; Hypertension in his father; Hypothyroidism in his mother. There is no history of Sudden death or Heart attack.  ROS:   ROS Please see the history of present illness.    *** All other systems reviewed and are negative.  EKGs/Labs/Other Studies Reviewed:    The following studies were reviewed today: ***      EKG:  EKG is *** ordered today.  The ekg ordered today is personally reviewed and demonstrates ***  Recent Labs: 11/23/2022: ALT 20; BUN 16; Creatinine, Ser 0.90; Hemoglobin 14.9; Platelets 295.0; Potassium 4.4; Sodium 138  Recent Lipid Panel    Component Value Date/Time   CHOL 189 12/09/2021 1317   TRIG 126.0 12/09/2021 1317   HDL 35.00 (L) 12/09/2021 1317   CHOLHDL 5 12/09/2021 1317   VLDL 25.2 12/09/2021 1317   LDLCALC 129 (H) 12/09/2021 1317   LDLCALC 116 (H) 11/23/2017 1606    Physical Exam:    VS:  There were no vitals taken for this visit.    Wt Readings from Last 3 Encounters:  11/23/22 290 lb (131.5 kg)  09/24/22 (!) 304 lb (137.9 kg)  09/21/22 300 lb (136.1 kg)     GEN: *** Well nourished, well developed in no acute distress HEENT: Normal NECK: No JVD; No carotid bruits LYMPHATICS: No lymphadenopathy CARDIAC: ***RRR, no murmurs, rubs, gallops RESPIRATORY:  Clear to auscultation without rales, wheezing or rhonchi   ABDOMEN: Soft, non-tender, non-distended MUSCULOSKELETAL:  No edema; No deformity  SKIN: Warm and dry NEUROLOGIC:  Alert and oriented x 3 PSYCHIATRIC:  Normal affect     Signed, Norman Herrlich, MD  12/22/2022 12:47 PM    [facility]

## 2022-12-23 ENCOUNTER — Ambulatory Visit: Payer: BC Managed Care – PPO | Admitting: Cardiology

## 2023-01-26 ENCOUNTER — Ambulatory Visit: Payer: BC Managed Care – PPO | Admitting: Cardiology

## 2023-02-01 ENCOUNTER — Encounter: Payer: Self-pay | Admitting: Family Medicine

## 2023-02-01 ENCOUNTER — Other Ambulatory Visit: Payer: Self-pay | Admitting: Family Medicine

## 2023-02-01 ENCOUNTER — Other Ambulatory Visit (HOSPITAL_COMMUNITY): Payer: Self-pay

## 2023-02-01 DIAGNOSIS — F40243 Fear of flying: Secondary | ICD-10-CM

## 2023-02-01 MED ORDER — DIAZEPAM 5 MG PO TABS
ORAL_TABLET | ORAL | 0 refills | Status: DC
Start: 2023-02-01 — End: 2023-06-16
  Filled 2023-02-01: qty 10, 10d supply, fill #0

## 2023-05-31 ENCOUNTER — Encounter: Payer: Self-pay | Admitting: Family Medicine

## 2023-06-01 ENCOUNTER — Other Ambulatory Visit (HOSPITAL_COMMUNITY): Payer: Self-pay

## 2023-06-01 MED ORDER — DIAZEPAM 5 MG PO TABS
5.0000 mg | ORAL_TABLET | Freq: Two times a day (BID) | ORAL | 0 refills | Status: DC | PRN
  Filled 2023-06-01: qty 12, 6d supply, fill #0

## 2023-06-02 ENCOUNTER — Other Ambulatory Visit (HOSPITAL_COMMUNITY): Payer: Self-pay

## 2023-06-11 ENCOUNTER — Encounter: Payer: Self-pay | Admitting: Emergency Medicine

## 2023-06-11 ENCOUNTER — Ambulatory Visit
Admission: EM | Admit: 2023-06-11 | Discharge: 2023-06-11 | Disposition: A | Discharge status: Home / Self Care | Payer: BC Managed Care – PPO | Attending: Internal Medicine | Admitting: Internal Medicine

## 2023-06-11 DIAGNOSIS — J101 Influenza due to other identified influenza virus with other respiratory manifestations: Secondary | ICD-10-CM | POA: Diagnosis not present

## 2023-06-11 LAB — POC COVID19/FLU A&B COMBO
Covid Antigen, POC: NEGATIVE
Influenza A Antigen, POC: POSITIVE — AB
Influenza B Antigen, POC: NEGATIVE

## 2023-06-11 MED ORDER — ONDANSETRON 4 MG PO TBDP: 4.0000 mg | ORAL_TABLET | Freq: Three times a day (TID) | ORAL | 0 refills | Status: DC | PRN

## 2023-06-11 MED ORDER — PROMETHAZINE-DM 6.25-15 MG/5ML PO SYRP: 5.0000 mL | ORAL_SOLUTION | Freq: Every evening | ORAL | 0 refills | Status: DC | PRN

## 2023-06-11 MED ORDER — OSELTAMIVIR PHOSPHATE 75 MG PO CAPS: 75.0000 mg | ORAL_CAPSULE | Freq: Two times a day (BID) | ORAL | 0 refills | Status: DC

## 2023-06-11 NOTE — Discharge Instructions (Signed)
You have the flu.  Give Tamiflu as prescribed every 12 hours for the next 5 days. Zofran every 8 hours as needed for nausea and vomiting. Tylenol and ibuprofen as needed for fever and aches/pains.  If you develop any new or worsening symptoms or if your symptoms do not start to improve, please return here or follow-up with your primary care provider. If your symptoms are severe, please go to the emergency room.

## 2023-06-11 NOTE — ED Triage Notes (Addendum)
Pt presents with fever, chills and sweating. States he had surgery on Thursday and during wake up he desated and had trouble waking up. States Post op they did imaging that showed cardiomegaly.    Took tylenol around noon Took ibuprofen about 3:30 Temp has been 101 when meds wear off

## 2023-06-11 NOTE — ED Provider Notes (Signed)
[name]    CSN: [id number] Arrival date & time: 06/11/23  1535      History   Chief Complaint Chief Complaint  Patient presents with   Fever    HPI Jariah Fischel is a 23 y.o. male.   Tan Chretien is a 23 y.o. male presenting for chief complaint of fever and sweats that started last night. He recently had surgical procedure to fix anal fissure 3 days ago and had wet cough after waking up from anesthesia requiring suction etc. They performed chest x-ray 3 days ago in PACU which showed cardiomegaly with otherwise normal findings. Cough has persisted over the last 3 days and has become intermittently dry. Reports generalized headache today with fever up to 101.0 when tylenol and ibuprofen wear off. Reports shortness of breath upon waking this morning and intermittent nausea, otherwise denies ear pain, sore throat, chest pain, dizziness, palpitations, rash, and recent known sick contacts with similar symptoms. Never smoker, vapes nicotine containing substance. Denies history of chronic respiratory problems. He is tolerating food and fluids without vomiting and remains on a bland diet due to recent rectal surgery. Taking tylenol and ibuprofen with some relief of fevers.    Fever   Past Medical History:  Diagnosis Date   Abscess    Acanthosis nigricans 02/13/2018   Allergy    Chronic diarrhea 12/26/2019   Elevated triglycerides with high cholesterol 12/26/2019   Epistaxis 04/27/2017   Inattention 11/23/2017   Left leg pain 06/27/2018   Low HDL (under 40) 02/13/2018   Lower leg mass, left 11/23/2017   Nausea 12/26/2019   Obesity (BMI 30-39.9) 08/09/2022   Severe obesity due to excess calories without serious comorbidity with body mass index (BMI) greater than 99th percentile for age in pediatric patient (HCC) 11/23/2017    Patient Active Problem List   Diagnosis Date Noted   Abscess    Allergy    Obesity (BMI 30-39.9) 08/09/2022   Elevated triglycerides with  high cholesterol 12/26/2019   Nausea 12/26/2019   Chronic diarrhea 12/26/2019   Left leg pain 06/27/2018   Acanthosis nigricans 02/13/2018   Low HDL (under 40) 02/13/2018   Inattention 11/23/2017   Severe obesity due to excess calories without serious comorbidity with body mass index (BMI) greater than 99th percentile for age in pediatric patient (HCC) 11/23/2017   Lower leg mass, left 11/23/2017   Epistaxis 04/27/2017    Past Surgical History:  Procedure Laterality Date   FRACTURE SURGERY Left    fracture left wrist 2006   HYDROCELE EXCISION / REPAIR     TONSILLECTOMY AND ADENOIDECTOMY         Home Medications    Prior to Admission medications   Medication Sig Start Date End Date Taking? Authorizing Provider  ondansetron (ZOFRAN-ODT) 4 MG disintegrating tablet Take 1 tablet (4 mg total) by mouth every 8 (eight) hours as needed for nausea or vomiting. 06/11/23  Yes Carlisle Beers, FNP  oseltamivir (TAMIFLU) 75 MG capsule Take 1 capsule (75 mg total) by mouth every 12 (twelve) hours. 06/11/23  Yes Carlisle Beers, FNP  promethazine-dextromethorphan (PROMETHAZINE-DM) 6.25-15 MG/5ML syrup Take 5 mLs by mouth at bedtime as needed for cough. 06/11/23  Yes Carlisle Beers, FNP  diazepam (VALIUM) 5 MG tablet Take 1 tablet by mouth 30-60 minutes prior to flights. 02/01/23   Sharlene Dory, DO  diazepam (VALIUM) 5 MG tablet Take 1 tablet (5 mg total) by mouth every 12 (twelve) hours as needed (pelvic muscle  spasm). Max Daily Amount: 10 mg. 06/01/23     hydrocortisone (ANUSOL-HC) 2.5 % rectal cream Place 1 Application rectally 2 (two) times daily as needed for hemorrhoids or anal itching.    [provider]  pregabalin (LYRICA) 25 MG capsule Take 1 capsule (25 mg total) by mouth 2 (two) times daily. 06/14/23   Sharlene Dory, DO    Family History Family History  Problem Relation Age of Onset   Diabetes Mother    Hyperlipidemia Mother     Hypothyroidism Mother    Diabetes Father    Hypertension Father    Birth defects Son    Cancer Maternal Grandmother        thyroid   Sudden death Neg Hx    Heart attack Neg Hx     Social History Social History   Tobacco Use   Smoking status: Never   Smokeless tobacco: Never  Vaping Use   Vaping status: Every Day   Substances: Nicotine  Substance Use Topics   Alcohol use: No    Alcohol/week: 0.0 standard drinks of alcohol   Drug use: No     Allergies   Sulfa antibiotics and Sulfamethoxazole   Review of Systems Review of Systems  Constitutional:  Positive for fever.  Per HPI   Physical Exam Triage Vital Signs ED Triage Vitals  Encounter Vitals Group     BP 06/11/23 1604 112/75     Systolic BP Percentile --      Diastolic BP Percentile --      Pulse Rate 06/11/23 1604 92     Resp 06/11/23 1604 17     Temp 06/11/23 1604 98.7 F (37.1 C)     Temp Source 06/11/23 1604 Oral     SpO2 06/11/23 1604 96 %     Weight --      Height --      Head Circumference --      Peak Flow --      Pain Score 06/11/23 1607 0     Pain Loc --      Pain Education --      Exclude from Growth Chart --    No data found.  Updated Vital Signs BP 112/75 (BP Location: Right Arm)   Pulse 92   Temp 98.7 F (37.1 C) (Oral)   Resp 17   SpO2 96%   Visual Acuity Right Eye Distance:   Left Eye Distance:   Bilateral Distance:    Right Eye Near:   Left Eye Near:    Bilateral Near:     Physical Exam Vitals and nursing note reviewed.  Constitutional:      Appearance: He is ill-appearing. He is not toxic-appearing.  HENT:     Head: Normocephalic and atraumatic.     Right Ear: Hearing, tympanic membrane, ear canal and external ear normal.     Left Ear: Hearing, tympanic membrane, ear canal and external ear normal.     Nose: Congestion present.     Mouth/Throat:     Lips: Pink.     Mouth: Mucous membranes are moist. No injury or oral lesions.     Dentition: Normal dentition.      Tongue: No lesions.     Pharynx: Oropharynx is clear. Uvula midline. No pharyngeal swelling, oropharyngeal exudate, posterior oropharyngeal erythema, uvula swelling or postnasal drip.     Tonsils: No tonsillar exudate.  Eyes:     General: Lids are normal. Vision grossly intact. Gaze aligned appropriately.  Extraocular Movements: Extraocular movements intact.     Conjunctiva/sclera: Conjunctivae normal.  Neck:     Trachea: Trachea and phonation normal.  Cardiovascular:     Rate and Rhythm: Normal rate and regular rhythm.     Heart sounds: Normal heart sounds, S1 normal and S2 normal.  Pulmonary:     Effort: Pulmonary effort is normal. No respiratory distress.     Breath sounds: Normal breath sounds and air entry. No wheezing, rhonchi or rales.  Chest:     Chest wall: No tenderness.  Musculoskeletal:     Cervical back: Neck supple.     Right lower leg: No edema.     Left lower leg: No edema.  Lymphadenopathy:     Cervical: No cervical adenopathy.  Skin:    General: Skin is warm.     Capillary Refill: Capillary refill takes less than 2 seconds.     Findings: No rash.  Neurological:     General: No focal deficit present.     Mental Status: He is alert and oriented to person, place, and time. Mental status is at baseline.     Cranial Nerves: No dysarthria or facial asymmetry.  Psychiatric:        Mood and Affect: Mood normal.        Speech: Speech normal.        Behavior: Behavior normal.        Thought Content: Thought content normal.        Judgment: Judgment normal.      UC Treatments / Results  Labs (all labs ordered are listed, but only abnormal results are displayed) Labs Reviewed  POC COVID19/FLU A&B COMBO - Abnormal; Notable for the following components:      Result Value   Influenza A Antigen, POC Positive (*)    All other components within normal limits    EKG   Radiology   Procedures Procedures (including critical care time)  Medications Ordered  in UC Medications - No data to display  Initial Impression / Assessment and Plan / UC Course  I have reviewed the triage vital signs and the nursing notes.  Pertinent labs & imaging results that were available during my care of the patient were reviewed by me and considered in my medical decision making (see chart for details).   1. Influenza A Flu A point of care testing positive. Lungs clear, vitals hemodynamically stable, therefore deferred imaging.  Offered antiviral given timing of illness, Tamiflu sent to pharmacy.  Recommend supportive care for further symptomatic relief as outlined in AVS.  Modes of transmission, quarantine recommendations, and hand hygiene discussed.   Counseled patient on potential for adverse effects with medications prescribed/recommended today, strict ER and return-to-clinic precautions discussed, patient verbalized understanding.    Final Clinical Impressions(s) / UC Diagnoses   Final diagnoses:  Influenza A     Discharge Instructions      You have the flu.  Give Tamiflu as prescribed every 12 hours for the next 5 days. Zofran every 8 hours as needed for nausea and vomiting. Tylenol and ibuprofen as needed for fever and aches/pains.  If you develop any new or worsening symptoms or if your symptoms do not start to improve, please return here or follow-up with your primary care provider. If your symptoms are severe, please go to the emergency room.      ED Prescriptions     Medication Sig Dispense Auth. Provider   oseltamivir (TAMIFLU) 75 MG capsule Take 1 capsule (  75 mg total) by mouth every 12 (twelve) hours. 10 capsule Carlisle Beers, FNP   ondansetron (ZOFRAN-ODT) 4 MG disintegrating tablet Take 1 tablet (4 mg total) by mouth every 8 (eight) hours as needed for nausea or vomiting. 20 tablet Carlisle Beers, FNP   promethazine-dextromethorphan (PROMETHAZINE-DM) 6.25-15 MG/5ML syrup Take 5 mLs by mouth at bedtime as needed for  cough. 118 mL Carlisle Beers, FNP      PDMP not reviewed this encounter.   Carlisle Beers, [place] 06/16/23 1002

## 2023-06-14 ENCOUNTER — Encounter: Payer: Self-pay | Admitting: Family Medicine

## 2023-06-14 ENCOUNTER — Ambulatory Visit: Payer: BC Managed Care – PPO | Admitting: Family Medicine

## 2023-06-14 ENCOUNTER — Other Ambulatory Visit (HOSPITAL_COMMUNITY): Payer: Self-pay

## 2023-06-14 VITALS — BP 136/82 | HR 98 | Temp 98.0°F | Resp 16 | Ht 76.0 in | Wt 299.0 lb

## 2023-06-14 DIAGNOSIS — K529 Noninfective gastroenteritis and colitis, unspecified: Secondary | ICD-10-CM

## 2023-06-14 DIAGNOSIS — K645 Perianal venous thrombosis: Secondary | ICD-10-CM

## 2023-06-14 DIAGNOSIS — I517 Cardiomegaly: Secondary | ICD-10-CM | POA: Diagnosis not present

## 2023-06-14 MED ORDER — PREGABALIN 25 MG PO CAPS
25.0000 mg | ORAL_CAPSULE | Freq: Two times a day (BID) | ORAL | 0 refills | Status: DC
Start: 2023-06-14 — End: 2024-02-17
  Filled 2023-06-14: qty 30, 15d supply, fill #0

## 2023-06-14 NOTE — Patient Instructions (Signed)
Please get your X-ray done in the basement of our [place] office located on: [address] [place], [place] [address]  You do not need an appointment for that location.   Ask the surgeon about a colonoscopy. If they do not agree, let me know and we will set you up with the GI team here.  Let me know if the Lyrica is not helping by itself.   OK to take Tylenol 1000 mg (2 extra strength tabs) or 975 mg (3 regular strength tabs) every 6 hours as needed.  Ice/cold pack over area for 10-15 min twice daily.  Continue sitz baths.  Let us know if you need anything.

## 2023-06-14 NOTE — Progress Notes (Signed)
Chief Complaint  Patient presents with   X-Ray    Discuss X-ray    Subjective: Patient is a 23 y.o. male here for f/u.  He is here with his mother.  On 06/09/2023, the patient had a rectal Botox procedure for an anal fissure.  Since that time, he has been not had any bleeding but has been extremely tender.  He has been using topical nifedipine with lidocaine and routine sitz bath's.  This has not been helpful.  His mother had shingles several months ago and had leftover Lyrica.  She gave him some which did seem to help.  He is requesting his own prescription.  He had no side effects without medication.  After his procedure, he had some shortness of breath coming off of anesthesia.  A chest x-ray was done, portable, being read as cardiomegaly with possible effusion.  He is not having any shortness of breath, lightheadedness, passing out, coughing, or chest pain.  Patient has a history of chronic diarrhea.  90% of the time his stools are loose.  No bleeding, pain, unintentional weight loss.  Reportedly as a child, he was going to have a colonoscopy but this never came to fruition.  He is going to discuss this with his colorectal surgeon next month when he meets with them.  Past Medical History:  Diagnosis Date   Abscess    Acanthosis nigricans 02/13/2018   Allergy    Chronic diarrhea 12/26/2019   Elevated triglycerides with high cholesterol 12/26/2019   Epistaxis 04/27/2017   Inattention 11/23/2017   Left leg pain 06/27/2018   Low HDL (under 40) 02/13/2018   Lower leg mass, left 11/23/2017   Nausea 12/26/2019   Obesity (BMI 30-39.9) 08/09/2022   Severe obesity due to excess calories without serious comorbidity with body mass index (BMI) greater than 99th percentile for age in pediatric patient (HCC) 11/23/2017    Objective: BP 136/82   Pulse 98   Temp 98 F (36.7 C) (Oral)   Resp 16   Ht 6\' 4"  (1.93 m)   Wt 299 lb (135.6 kg)   SpO2 98%   BMI 36.40 kg/m  General: Awake, appears  stated age Heart: RRR, no LE edema Lungs: CTAB, no rales, wheezes or rhonchi. No accessory muscle use Psych: Age appropriate judgment and insight, normal affect and mood  Assessment and Plan: External hemorrhoid, thrombosed - Plan: pregabalin (LYRICA) 25 MG capsule  Enlarged heart - Plan: DG Chest 2 View  Chronic diarrhea  Will send his own Lyrica.  Likely having some nerve pain from the procedure.  He also took Valium at home, if he needs some of this, he will let me know.  Would prefer to try just the Lyrica first.  Continue with nifedipine/lidocaine topically and soaking the area. Probably related to portable x-ray.  He does not have tamponade physiology.  Do not appreciate any rubs or suggestion of pericarditis.  Will check a two-view x-ray.  If there is any concern, we will check an echo and proceed accordingly. He does have a history of chronic diarrhea.  Seems to be slightly better in early adulthood.  He will check with his colorectal surgeon to see if he needs any direct imaging.  If the surgery team does not think he needs this, he will reach out to me and I will refer him to the [facility] gastroenterology team. Follow-up as originally scheduled. The patient and his mother voiced understanding and agreement to the plan.  I spent 42 minutes  with the patient discussing the above plans in addition to reviewing his chart on the same day of the visit.  Jilda Roche [place], DO 06/14/23  4:41 PM

## 2023-06-16 ENCOUNTER — Other Ambulatory Visit: Payer: Self-pay

## 2023-06-16 ENCOUNTER — Encounter (HOSPITAL_COMMUNITY): Payer: Self-pay | Admitting: Emergency Medicine

## 2023-06-16 ENCOUNTER — Encounter: Payer: Self-pay | Admitting: Family Medicine

## 2023-06-16 ENCOUNTER — Emergency Department (HOSPITAL_COMMUNITY): Payer: BC Managed Care – PPO

## 2023-06-16 ENCOUNTER — Emergency Department (HOSPITAL_COMMUNITY)
Admission: EM | Admit: 2023-06-16 | Discharge: 2023-06-16 | Disposition: A | Discharge status: Home / Self Care | Payer: BC Managed Care – PPO | Attending: Emergency Medicine | Admitting: Emergency Medicine

## 2023-06-16 ENCOUNTER — Other Ambulatory Visit (HOSPITAL_COMMUNITY): Payer: Self-pay

## 2023-06-16 ENCOUNTER — Other Ambulatory Visit: Payer: Self-pay | Admitting: Family Medicine

## 2023-06-16 DIAGNOSIS — K644 Residual hemorrhoidal skin tags: Secondary | ICD-10-CM | POA: Diagnosis not present

## 2023-06-16 DIAGNOSIS — F40243 Fear of flying: Secondary | ICD-10-CM

## 2023-06-16 DIAGNOSIS — K6289 Other specified diseases of anus and rectum: Secondary | ICD-10-CM | POA: Diagnosis present

## 2023-06-16 LAB — COMPREHENSIVE METABOLIC PANEL
ALT: 66 U/L — ABNORMAL HIGH (ref 0–44)
AST: 21 U/L (ref 15–41)
Albumin: 3.6 g/dL (ref 3.5–5.0)
Alkaline Phosphatase: 35 U/L — ABNORMAL LOW (ref 38–126)
Anion gap: 8 (ref 5–15)
BUN: 21 mg/dL — ABNORMAL HIGH (ref 6–20)
CO2: 30 mmol/L (ref 22–32)
Calcium: 9 mg/dL (ref 8.9–10.3)
Chloride: 103 mmol/L (ref 98–111)
Creatinine, Ser: 0.86 mg/dL (ref 0.61–1.24)
GFR, Estimated: >60 mL/min (ref >60)
Glucose, Bld: 131 mg/dL — ABNORMAL HIGH (ref 70–99)
Potassium: 4.1 mmol/L (ref 3.5–5.1)
Sodium: 141 mmol/L (ref 135–145)
Total Bilirubin: 0.5 mg/dL (ref 0.0–1.2)
Total Protein: 7.3 g/dL (ref 6.5–8.1)

## 2023-06-16 LAB — CBC WITH DIFFERENTIAL/PLATELET
Abs Immature Granulocytes: 0.02 10*3/uL (ref 0.00–0.07)
Basophils Absolute: 0 10*3/uL (ref 0.0–0.1)
Basophils Relative: 1 %
Eosinophils Absolute: 0.1 10*3/uL (ref 0.0–0.5)
Eosinophils Relative: 2 %
HCT: 42.9 % (ref 39.0–52.0)
Hemoglobin: 14.1 g/dL (ref 13.0–17.0)
Immature Granulocytes: 0 %
Lymphocytes Relative: 44 %
Lymphs Abs: 3.5 10*3/uL (ref 0.7–4.0)
MCH: 29.1 pg (ref 26.0–34.0)
MCHC: 32.9 g/dL (ref 30.0–36.0)
MCV: 88.5 fL (ref 80.0–100.0)
Monocytes Absolute: 0.6 10*3/uL (ref 0.1–1.0)
Monocytes Relative: 7 %
Neutro Abs: 3.7 10*3/uL (ref 1.7–7.7)
Neutrophils Relative %: 46 %
Platelets: 269 10*3/uL (ref 150–400)
RBC: 4.85 MIL/uL (ref 4.22–5.81)
RDW: 13.2 % (ref 11.5–15.5)
WBC: 7.9 10*3/uL (ref 4.0–10.5)
nRBC: 0 % (ref 0.0–0.2)

## 2023-06-16 MED ORDER — IOHEXOL 300 MG/ML  SOLN
100.0000 mL | Freq: Once | INTRAMUSCULAR | Status: AC | PRN
  Administered 2023-06-16: 100 mL via INTRAVENOUS

## 2023-06-16 MED ORDER — DIAZEPAM 5 MG PO TABS
5.0000 mg | ORAL_TABLET | Freq: Three times a day (TID) | ORAL | 0 refills | Status: DC | PRN
Start: 2023-06-16 — End: 2024-01-19
  Filled 2023-06-16: qty 15, 5d supply, fill #0

## 2023-06-16 MED ORDER — KETOROLAC TROMETHAMINE 15 MG/ML IJ SOLN
15.0000 mg | Freq: Once | INTRAMUSCULAR | Status: AC
  Administered 2023-06-16: 15 mg via INTRAVENOUS
  Filled 2023-06-16: qty 1

## 2023-06-16 NOTE — ED Triage Notes (Signed)
Pt had a colo-rectal botox on Thursday and stated he would feel pain relief in a few days. Pt reports increased pain, no relief and here to find out what's going on. Call surgeon who stated he should come in if no relief.

## 2023-06-16 NOTE — ED Provider Notes (Signed)
[place] [facility] AT [facility] Provider Note  CSN: [id number] Arrival date & time: 06/16/23 0001  Chief Complaint(s) Post-op Problem  HPI Donald Brooks is a 23 y.o. male with a past medical history listed below who presents to the emergency department with perianal pain.  Patient is status post anal Botox injections performed on January 16 for anal fissures.  Reports he has been having increasing pain since the procedure.  Denies any constipation stating he has loose stools and has been taking laxatives as directed.  Patient has noted a " growth" that is tender to palpation.  Patient called his surgeon who recommended being evaluated in the emergency department.  Patient seen by his primary care provider yesterday who is treating patient for thrombosed external hemorrhoid with Lyrica.  The history is provided by the patient.    Past Medical History Past Medical History:  Diagnosis Date   Abscess    Acanthosis nigricans 02/13/2018   Allergy    Chronic diarrhea 12/26/2019   Elevated triglycerides with high cholesterol 12/26/2019   Epistaxis 04/27/2017   Inattention 11/23/2017   Left leg pain 06/27/2018   Low HDL (under 40) 02/13/2018   Lower leg mass, left 11/23/2017   Nausea 12/26/2019   Obesity (BMI 30-39.9) 08/09/2022   Severe obesity due to excess calories without serious comorbidity with body mass index (BMI) greater than 99th percentile for age in pediatric patient (HCC) 11/23/2017   Patient Active Problem List   Diagnosis Date Noted   Abscess    Allergy    Obesity (BMI 30-39.9) 08/09/2022   Elevated triglycerides with high cholesterol 12/26/2019   Nausea 12/26/2019   Chronic diarrhea 12/26/2019   Left leg pain 06/27/2018   Acanthosis nigricans 02/13/2018   Low HDL (under 40) 02/13/2018   Inattention 11/23/2017   Severe obesity due to excess calories without serious comorbidity with body mass index (BMI) greater than 99th percentile for age  in pediatric patient (HCC) 11/23/2017   Lower leg mass, left 11/23/2017   Epistaxis 04/27/2017   Home Medication(s) Prior to Admission medications   Medication Sig Start Date End Date Taking? Authorizing Provider  diazepam (VALIUM) 5 MG tablet Take 1 tablet by mouth 30-60 minutes prior to flights. 02/01/23   Sharlene Dory, DO  diazepam (VALIUM) 5 MG tablet Take 1 tablet (5 mg total) by mouth every 12 (twelve) hours as needed (pelvic muscle spasm). Max Daily Amount: 10 mg. 06/01/23     hydrocortisone (ANUSOL-HC) 2.5 % rectal cream Place 1 Application rectally 2 (two) times daily as needed for hemorrhoids or anal itching.    [provider]  ondansetron (ZOFRAN-ODT) 4 MG disintegrating tablet Take 1 tablet (4 mg total) by mouth every 8 (eight) hours as needed for nausea or vomiting. 06/11/23   Carlisle Beers, FNP  oseltamivir (TAMIFLU) 75 MG capsule Take 1 capsule (75 mg total) by mouth every 12 (twelve) hours. 06/11/23   Carlisle Beers, FNP  pregabalin (LYRICA) 25 MG capsule Take 1 capsule (25 mg total) by mouth 2 (two) times daily. 06/14/23   Sharlene Dory, DO  promethazine-dextromethorphan (PROMETHAZINE-DM) 6.25-15 MG/5ML syrup Take 5 mLs by mouth at bedtime as needed for cough. 06/11/23   Carlisle Beers, FNP  Allergies Sulfa antibiotics and Sulfamethoxazole  Review of Systems Review of Systems As noted in HPI  Physical Exam Vital Signs  I have reviewed the triage vital signs BP (!) 173/97 (BP Location: Right Arm)   Pulse 82   Temp 98 F (36.7 C) (Oral)   Resp 16   SpO2 100%   Physical Exam Vitals reviewed. Exam conducted with a chaperone present.  Constitutional:      General: He is not in acute distress.    Appearance: He is well-developed. He is obese. He is not diaphoretic.  HENT:     Head: Normocephalic  and atraumatic.     Right Ear: External ear normal.     Left Ear: External ear normal.     Nose: Nose normal.     Mouth/Throat:     Mouth: Mucous membranes are moist.  Eyes:     General: No scleral icterus.    Conjunctiva/sclera: Conjunctivae normal.  Neck:     Trachea: Phonation normal.  Cardiovascular:     Rate and Rhythm: Normal rate and regular rhythm.  Pulmonary:     Effort: Pulmonary effort is normal. No respiratory distress.     Breath sounds: No stridor.  Abdominal:     General: There is no distension.  Genitourinary:    Rectum: Tenderness and external hemorrhoid present. No anal fissure.  Musculoskeletal:        General: Normal range of motion.     Cervical back: Normal range of motion.  Neurological:     Mental Status: He is alert and oriented to person, place, and time.  Psychiatric:        Behavior: Behavior normal.     ED Results and Treatments Labs (all labs ordered are listed, but only abnormal results are displayed) Labs Reviewed  COMPREHENSIVE METABOLIC PANEL - Abnormal; Notable for the following components:      Result Value   Glucose, Bld 131 (*)    BUN 21 (*)    ALT 66 (*)    Alkaline Phosphatase 35 (*)    All other components within normal limits  CBC WITH DIFFERENTIAL/PLATELET                                                                                                                         EKG  EKG Interpretation Date/Time:    Ventricular Rate:    PR Interval:    QRS Duration:    QT Interval:    QTC Calculation:   R Axis:      Text Interpretation:         Radiology CT ABDOMEN PELVIS W CONTRAST Result Date: 06/16/2023 CLINICAL DATA:  Perianal abscess or fistula suspected. Increased pain since colorectal botox for fistula 1 week ago. EXAM: CT ABDOMEN AND PELVIS WITH CONTRAST TECHNIQUE: Multidetector CT imaging of the abdomen and pelvis was performed using the standard protocol following bolus administration of intravenous contrast.  RADIATION DOSE REDUCTION: This exam was performed according to the departmental  dose-optimization program which includes automated exposure control, adjustment of the mA and/or kV according to patient size and/or use of iterative reconstruction technique. CONTRAST:  OMNIPAQUE IOHEXOL 300 MG/ML  SOLN COMPARISON:  CT with IV contrast 09/21/2022, 12/13/2018. FINDINGS: Lower chest: No abnormality. Hepatobiliary: The liver is 21 cm length with mild steatosis. There is no mass enhancement. The gallbladder and bile ducts are unremarkable. Pancreas: No abnormality. Spleen: No abnormality. No splenomegaly. Small splenule anteriorly. Adrenals/Urinary Tract: Adrenal glands are unremarkable. Kidneys are normal, without renal calculi, focal lesion, or hydronephrosis. Bladder is unremarkable. Stomach/Bowel: No dilatation or wall thickening including the appendix. There is a small stone in the mid appendix, as before. Scattered colonic diverticula without diverticulitis. No enhancing linear fistulous tracts noted in the vicinity of the anus and rectum and no abscess in the area is seen. Vascular/Lymphatic: No significant vascular findings are present. No enlarged abdominal or pelvic lymph nodes. Reproductive: Prostate is unremarkable. Other: No abdominal wall hernia or abnormality. No abdominopelvic ascites. Musculoskeletal: There is a unilateral right L5 pars defect, chronic slight grade 1 L5-S1 anterolisthesis and partial loss of the L5-S1 disc space chronically. There is increased right paracentral shallow disc protrusion at L5-S1, possible slight mass effect on the right S1 nerve root. No acute or other significant osseous findings are seen. No other focal pathologic process. IMPRESSION: 1. No acute CT findings in the abdomen or pelvis. 2. No appreciable perianorectal fistula or abscess. 3. Diverticulosis without evidence of diverticulitis. 4. Mild hepatomegaly with mild steatosis. 5. Unilateral right L5 pars defect with  chronic slight grade 1 L5-S1 anterolisthesis and partial loss of the L5-S1 disc space. 6. Increased right paracentral shallow disc protrusion at L5-S1, with possible slight mass effect on the right S1 nerve root. Electronically Signed   By: Almira Bar M.D.   On: 06/16/2023 02:21    Medications Ordered in ED Medications  iohexol (OMNIPAQUE) 300 MG/ML solution 100 mL (100 mLs Intravenous Contrast Given 06/16/23 0147)  ketorolac (TORADOL) 15 MG/ML injection 15 mg (15 mg Intravenous Given 06/16/23 0201)   Procedures Procedures  (including critical care time) Medical Decision Making / ED Course   Medical Decision Making Amount and/or Complexity of Data Reviewed Labs: ordered. Decision-making details documented in ED Course. Radiology: ordered and independent interpretation performed. Decision-making details documented in ED Course.  Risk Prescription drug management.    Perianal pain following anal sphincter Botox injections.  Differential diagnosis the workup considered below  Hemorrhoid versus postprocedural abscess.  No evidence of prolapse.  Exam not concerning for Fournier's.  CBC without leukocytosis or anemia.  Metabolic panel without significant electrolyte derangements or renal sufficiency. CT scan negative for abscess. Continued supportive management recommended. F/u with PCP/surgeon recommended.    Final Clinical Impression(s) / ED Diagnoses Final diagnoses:  External hemorrhoid   The patient appears reasonably screened and/or stabilized for discharge and I doubt any other medical condition or other [facility] requiring further screening, evaluation, or treatment in the ED at this time. I have discussed the findings, Dx and Tx plan with the patient/family who expressed understanding and agree(s) with the plan. Discharge instructions discussed at length. The patient/family was given strict return precautions who verbalized understanding of the instructions. No further questions  at time of discharge.  Disposition: Discharge  Condition: Good  ED Discharge Orders     None         Follow Up: Sharlene Dory, DO [address] [place] [address] [phone]  Call  to schedule an appointment for close follow up    This chart was dictated using voice recognition software.  Despite best efforts to proofread,  errors can occur which can change the documentation meaning.    Nira Conn, MD 06/16/23 [phone]

## 2023-06-16 NOTE — ED Notes (Signed)
Patient transported to CT 

## 2023-06-20 ENCOUNTER — Other Ambulatory Visit (HOSPITAL_COMMUNITY): Payer: Self-pay

## 2023-06-20 ENCOUNTER — Encounter: Payer: Self-pay | Admitting: Emergency Medicine

## 2023-06-20 ENCOUNTER — Ambulatory Visit
Admission: EM | Admit: 2023-06-20 | Discharge: 2023-06-20 | Disposition: A | Discharge status: Home / Self Care | Payer: BC Managed Care – PPO | Attending: Internal Medicine | Admitting: Internal Medicine

## 2023-06-20 DIAGNOSIS — H6693 Otitis media, unspecified, bilateral: Secondary | ICD-10-CM | POA: Diagnosis not present

## 2023-06-20 MED ORDER — AMOXICILLIN-POT CLAVULANATE 875-125 MG PO TABS
1.0000 | ORAL_TABLET | Freq: Two times a day (BID) | ORAL | 0 refills | Status: DC
  Filled 2023-06-20: qty 14, 7d supply, fill #0

## 2023-06-20 NOTE — Discharge Instructions (Signed)
Take augmentin every 12 hours for the next 7 days to treat bilateral ear infections. Tylenol as needed for pain. Ibuprofen as needed for pain.  If you develop any new or worsening symptoms or if your symptoms do not start to improve, please return here or follow-up with your primary care provider. If your symptoms are severe, please go to the emergency room.

## 2023-06-20 NOTE — ED Provider Notes (Signed)
[facility]    CSN: [id number] Arrival date & time: 06/20/23  7829      History   Chief Complaint Chief Complaint  Patient presents with   Nasal Congestion   Otalgia    HPI [facility] is a 23 y.o. male.   Patient presents to urgent care for evaluation of nasal congestion and ear pain that started a few days ago.  Recently diagnosed with influenza A last week and took Tamiflu.  States symptoms have improved significantly related to flu and he no longer has fever.  Reports significant pain to the left ear and "underwater sensation" to the left ear.  No recent fevers, chills, nausea, vomiting, shortness of breath, chest pain, palpitations, persistent throat pain, or dizziness.  He has not noticed any tinnitus or drainage from the left ear.  Denies recent antibiotic use.  Taking ibuprofen for ear pain with minimal relief.   Otalgia   Past Medical History:  Diagnosis Date   Abscess    Acanthosis nigricans 02/13/2018   Allergy    Chronic diarrhea 12/26/2019   Elevated triglycerides with high cholesterol 12/26/2019   Epistaxis 04/27/2017   Inattention 11/23/2017   Left leg pain 06/27/2018   Low HDL (under 40) 02/13/2018   Lower leg mass, left 11/23/2017   Nausea 12/26/2019   Obesity (BMI 30-39.9) 08/09/2022   Severe obesity due to excess calories without serious comorbidity with body mass index (BMI) greater than 99th percentile for age in pediatric patient (HCC) 11/23/2017    Patient Active Problem List   Diagnosis Date Noted   Abscess    Allergy    Obesity (BMI 30-39.9) 08/09/2022   Elevated triglycerides with high cholesterol 12/26/2019   Nausea 12/26/2019   Chronic diarrhea 12/26/2019   Left leg pain 06/27/2018   Acanthosis nigricans 02/13/2018   Low HDL (under 40) 02/13/2018   Inattention 11/23/2017   Severe obesity due to excess calories without serious comorbidity with body mass index (BMI) greater than 99th percentile for age in pediatric  patient (HCC) 11/23/2017   Lower leg mass, left 11/23/2017   Epistaxis 04/27/2017    Past Surgical History:  Procedure Laterality Date   FRACTURE SURGERY Left    fracture left wrist 2006   HYDROCELE EXCISION / REPAIR     TONSILLECTOMY AND ADENOIDECTOMY         Home Medications    Prior to Admission medications   Medication Sig Start Date End Date Taking? Authorizing Provider  amoxicillin-clavulanate (AUGMENTIN) 875-125 MG tablet Take 1 tablet by mouth every 12 (twelve) hours. 06/20/23  Yes Carlisle Beers, FNP  diazepam (VALIUM) 5 MG tablet Take 1 tablet (5 mg total) by mouth every 8 (eight) hours as needed for muscle spasms. Take 1 tablet by mouth 30-60 minutes prior to flights. 06/16/23   Sharlene Dory, DO  hydrocortisone (ANUSOL-HC) 2.5 % rectal cream Place 1 Application rectally 2 (two) times daily as needed for hemorrhoids or anal itching.    [provider]  ondansetron (ZOFRAN-ODT) 4 MG disintegrating tablet Take 1 tablet (4 mg total) by mouth every 8 (eight) hours as needed for nausea or vomiting. 06/11/23   Carlisle Beers, FNP  oseltamivir (TAMIFLU) 75 MG capsule Take 1 capsule (75 mg total) by mouth every 12 (twelve) hours. Patient not taking: Reported on 06/20/2023 06/11/23   Carlisle Beers, FNP  pregabalin (LYRICA) 25 MG capsule Take 1 capsule (25 mg total) by mouth 2 (two) times daily. 06/14/23   Wendling,  Jilda Roche, DO  promethazine-dextromethorphan (PROMETHAZINE-DM) 6.25-15 MG/5ML syrup Take 5 mLs by mouth at bedtime as needed for cough. Patient not taking: Reported on 06/20/2023 06/11/23   Carlisle Beers, FNP    Family History Family History  Problem Relation Age of Onset   Diabetes Mother    Hyperlipidemia Mother    Hypothyroidism Mother    Diabetes Father    Hypertension Father    Birth defects Son    Cancer Maternal Grandmother        thyroid   Sudden death Neg Hx    Heart attack Neg Hx     Social  History Social History   Tobacco Use   Smoking status: Never   Smokeless tobacco: Never  Vaping Use   Vaping status: Every Day   Substances: Nicotine  Substance Use Topics   Alcohol use: No    Alcohol/week: 0.0 standard drinks of alcohol   Drug use: No     Allergies   Sulfa antibiotics and Sulfamethoxazole   Review of Systems Review of Systems  HENT:  Positive for ear pain.   Per HPI  Physical Exam Triage Vital Signs ED Triage Vitals  Encounter Vitals Group     BP 06/20/23 0907 125/80     Systolic BP Percentile --      Diastolic BP Percentile --      Pulse Rate 06/20/23 0907 79     Resp 06/20/23 0907 18     Temp 06/20/23 0907 97.8 F (36.6 C)     Temp Source 06/20/23 0907 Oral     SpO2 06/20/23 0907 97 %     Weight --      Height --      Head Circumference --      Peak Flow --      Pain Score 06/20/23 0906 8     Pain Loc --      Pain Education --      Exclude from Growth Chart --    No data found.  Updated Vital Signs BP 125/80 (BP Location: Right Arm)   Pulse 79   Temp 97.8 F (36.6 C) (Oral)   Resp 18   SpO2 97%   Visual Acuity Right Eye Distance:   Left Eye Distance:   Bilateral Distance:    Right Eye Near:   Left Eye Near:    Bilateral Near:     Physical Exam Vitals and nursing note reviewed.  Constitutional:      Appearance: He is not ill-appearing or toxic-appearing.  HENT:     Head: Normocephalic and atraumatic.     Right Ear: Hearing, ear canal and external ear normal. Tympanic membrane is erythematous. Tympanic membrane is not perforated.     Left Ear: Hearing, ear canal and external ear normal. Tenderness present. Tympanic membrane is erythematous and bulging. Tympanic membrane is not perforated.     Nose: Nose normal.     Mouth/Throat:     Lips: Pink.     Mouth: Mucous membranes are moist. No injury or oral lesions.     Dentition: Normal dentition.     Tongue: No lesions.     Pharynx: Oropharynx is clear. Uvula midline. No  pharyngeal swelling, oropharyngeal exudate, posterior oropharyngeal erythema, uvula swelling or postnasal drip.     Tonsils: No tonsillar exudate.  Eyes:     General: Lids are normal. Vision grossly intact. Gaze aligned appropriately.     Extraocular Movements: Extraocular movements intact.     Conjunctiva/sclera:  Conjunctivae normal.  Neck:     Trachea: Trachea and phonation normal.  Cardiovascular:     Rate and Rhythm: Normal rate and regular rhythm.     Heart sounds: Normal heart sounds, S1 normal and S2 normal.  Pulmonary:     Effort: Pulmonary effort is normal. No respiratory distress.     Breath sounds: Normal breath sounds and air entry.  Musculoskeletal:     Cervical back: Neck supple.  Lymphadenopathy:     Cervical: No cervical adenopathy.  Skin:    General: Skin is warm and dry.     Capillary Refill: Capillary refill takes less than 2 seconds.     Findings: No rash.  Neurological:     General: No focal deficit present.     Mental Status: He is alert and oriented to person, place, and time. Mental status is at baseline.     Cranial Nerves: No dysarthria or facial asymmetry.  Psychiatric:        Mood and Affect: Mood normal.        Speech: Speech normal.        Behavior: Behavior normal.        Thought Content: Thought content normal.        Judgment: Judgment normal.      UC Treatments / Results  Labs (all labs ordered are listed, but only abnormal results are displayed) Labs Reviewed - No data to display  EKG   Radiology No results found.  Procedures Procedures (including critical care time)  Medications Ordered in UC Medications - No data to display  Initial Impression / Assessment and Plan / UC Course  I have reviewed the triage vital signs and the nursing notes.  Pertinent labs & imaging results that were available during my care of the patient were reviewed by me and considered in my medical decision making (see chart for details).   1.   Bilateral otitis media Both ears appear infected on exam. Augmentin prescribed to be taken every 12 hours for the next 7 days. Ibuprofen/Tylenol as needed for pain. No evidence of tympanic membrane perforation.  Counseled patient on potential for adverse effects with medications prescribed/recommended today, strict ER and return-to-clinic precautions discussed, patient verbalized understanding.    Final Clinical Impressions(s) / UC Diagnoses   Final diagnoses:  Bilateral otitis media, unspecified otitis media type     Discharge Instructions      Take augmentin every 12 hours for the next 7 days to treat bilateral ear infections. Tylenol as needed for pain. Ibuprofen as needed for pain.  If you develop any new or worsening symptoms or if your symptoms do not start to improve, please return here or follow-up with your primary care provider. If your symptoms are severe, please go to the emergency room.    ED Prescriptions     Medication Sig Dispense Auth. Provider   amoxicillin-clavulanate (AUGMENTIN) 875-125 MG tablet Take 1 tablet by mouth every 12 (twelve) hours. 14 tablet Carlisle Beers, FNP      PDMP not reviewed this encounter.   Reita May [place], [place] 06/20/23 [phone]

## 2023-06-20 NOTE — ED Triage Notes (Signed)
Pt was seen one week ago and tested positive for flu. Today he c/o left ear pain, headache, and congestion for few days.

## 2023-07-14 ENCOUNTER — Ambulatory Visit
Admission: EM | Admit: 2023-07-14 | Discharge: 2023-07-14 | Disposition: A | Discharge status: Home / Self Care | Payer: BC Managed Care – PPO | Attending: Internal Medicine | Admitting: Internal Medicine

## 2023-07-14 ENCOUNTER — Encounter: Payer: Self-pay | Admitting: Emergency Medicine

## 2023-07-14 DIAGNOSIS — J069 Acute upper respiratory infection, unspecified: Secondary | ICD-10-CM

## 2023-07-14 DIAGNOSIS — H66005 Acute suppurative otitis media without spontaneous rupture of ear drum, recurrent, left ear: Secondary | ICD-10-CM

## 2023-07-14 DIAGNOSIS — Z720 Tobacco use: Secondary | ICD-10-CM

## 2023-07-14 MED ORDER — PROMETHAZINE-DM 6.25-15 MG/5ML PO SYRP: 5.0000 mL | ORAL_SOLUTION | Freq: Every evening | ORAL | 0 refills | Status: DC | PRN

## 2023-07-14 MED ORDER — FLUTICASONE PROPIONATE 50 MCG/ACT NA SUSP: 2.0000 | Freq: Every day | NASAL | 0 refills | Status: DC

## 2023-07-14 MED ORDER — CEFDINIR 300 MG PO CAPS: 300.0000 mg | ORAL_CAPSULE | Freq: Two times a day (BID) | ORAL | 0 refills | Status: AC

## 2023-07-14 NOTE — Discharge Instructions (Addendum)
You have a viral upper respiratory tract infection. You also have a left ear infection that has been persistent.  Take cefdinir antibiotic every 12 hours for the next 10 days to treat left ear infection.  Use Flonase nasal spray 2 puffs into each nostril once daily for the next 3 weeks to reduce swelling and tenderness to the ears and eustachian tubes.  You may continue taking Promethazine DM at bedtime as needed for coughing.  Mucinex may help with nasal congestion and cough as well.  Lungs are clear today, however if you spike a fever or if you develop chest pain or shortness of breath, I would like for you to return to the clinic and we will do a chest x-ray at that time.  If you develop any new or worsening symptoms or if your symptoms do not start to improve, please return here or follow-up with your primary care provider. If your symptoms are severe, please go to the emergency room.

## 2023-07-14 NOTE — ED Provider Notes (Signed)
[facility]    CSN: [id number] Arrival date & time: 07/14/23  1058      History   Chief Complaint No chief complaint on file.   HPI [facility] is a 23 y.o. male.   [facility] is a 23 y.o. male presenting for chief complaint of cough, nasal congestion, ear pain, generalized fatigue, and sore throat that started 5 days ago. His mom is sick with similar symptoms. Cough is worse at nighttime and somewhat productive. Nasal congestion is thick and yellow/green.  He continues to have bilateral ear pain after being treated for bilateral ear infections approximately 3 weeks ago with Augmentin.  He took Augmentin as prescribed and has not had any other recent steroid or antibiotic use.  Reports tinnitus and decreased/muffled hearing to the left ear.  No drainage from the ear reported.  Denies shortness of breath, chest pain, fever, chills, heart palpitations, nausea, vomiting, diarrhea, and abdominal pain/rash.  Vapes nicotine containing substance, denies other drug or cigarette use.  He is taking over-the-counter medications to help with symptoms with some relief.     Past Medical History:  Diagnosis Date   Abscess    Acanthosis nigricans 02/13/2018   Allergy    Chronic diarrhea 12/26/2019   Elevated triglycerides with high cholesterol 12/26/2019   Epistaxis 04/27/2017   Inattention 11/23/2017   Left leg pain 06/27/2018   Low HDL (under 40) 02/13/2018   Lower leg mass, left 11/23/2017   Nausea 12/26/2019   Obesity (BMI 30-39.9) 08/09/2022   Severe obesity due to excess calories without serious comorbidity with body mass index (BMI) greater than 99th percentile for age in pediatric patient (HCC) 11/23/2017    Patient Active Problem List   Diagnosis Date Noted   Abscess    Allergy    Obesity (BMI 30-39.9) 08/09/2022   Elevated triglycerides with high cholesterol 12/26/2019   Nausea 12/26/2019   Chronic diarrhea 12/26/2019   Left leg pain 06/27/2018    Acanthosis nigricans 02/13/2018   Low HDL (under 40) 02/13/2018   Inattention 11/23/2017   Severe obesity due to excess calories without serious comorbidity with body mass index (BMI) greater than 99th percentile for age in pediatric patient (HCC) 11/23/2017   Lower leg mass, left 11/23/2017   Epistaxis 04/27/2017    Past Surgical History:  Procedure Laterality Date   FRACTURE SURGERY Left    fracture left wrist 2006   HYDROCELE EXCISION / REPAIR     TONSILLECTOMY AND ADENOIDECTOMY         Home Medications    Prior to Admission medications   Medication Sig Start Date End Date Taking? Authorizing Provider  cefdinir (OMNICEF) 300 MG capsule Take 1 capsule (300 mg total) by mouth 2 (two) times daily for 10 days. 07/14/23 07/24/23 Yes Carlisle Beers, FNP  fluticasone (FLONASE) 50 MCG/ACT nasal spray Place 2 sprays into both nostrils daily. 07/14/23  Yes Carlisle Beers, FNP  promethazine-dextromethorphan (PROMETHAZINE-DM) 6.25-15 MG/5ML syrup Take 5 mLs by mouth at bedtime as needed for cough. 07/14/23  Yes Carlisle Beers, FNP  diazepam (VALIUM) 5 MG tablet Take 1 tablet (5 mg total) by mouth every 8 (eight) hours as needed for muscle spasms. Take 1 tablet by mouth 30-60 minutes prior to flights. Patient not taking: Reported on 07/14/2023 06/16/23   Sharlene Dory, DO  hydrocortisone (ANUSOL-HC) 2.5 % rectal cream Place 1 Application rectally 2 (two) times daily as needed for hemorrhoids or anal itching.    [provider]  ondansetron (ZOFRAN-ODT) 4 MG disintegrating tablet Take 1 tablet (4 mg total) by mouth every 8 (eight) hours as needed for nausea or vomiting. 06/11/23   Carlisle Beers, FNP  oseltamivir (TAMIFLU) 75 MG capsule Take 1 capsule (75 mg total) by mouth every 12 (twelve) hours. Patient not taking: Reported on 06/20/2023 06/11/23   Carlisle Beers, FNP  pregabalin (LYRICA) 25 MG capsule Take 1 capsule (25 mg total) by mouth 2 (two)  times daily. 06/14/23   Sharlene Dory, DO    Family History Family History  Problem Relation Age of Onset   Diabetes Mother    Hyperlipidemia Mother    Hypothyroidism Mother    Diabetes Father    Hypertension Father    Birth defects Son    Cancer Maternal Grandmother        thyroid   Sudden death Neg Hx    Heart attack Neg Hx     Social History Social History   Tobacco Use   Smoking status: Never   Smokeless tobacco: Never  Vaping Use   Vaping status: Every Day   Substances: Nicotine  Substance Use Topics   Alcohol use: No    Alcohol/week: 0.0 standard drinks of alcohol   Drug use: No     Allergies   Sulfa antibiotics and Sulfamethoxazole   Review of Systems Review of Systems Per HPI  Physical Exam Triage Vital Signs ED Triage Vitals  Encounter Vitals Group     BP 07/14/23 1156 123/76     Systolic BP Percentile --      Diastolic BP Percentile --      Pulse Rate 07/14/23 1156 70     Resp 07/14/23 1156 16     Temp 07/14/23 1156 98.8 F (37.1 C)     Temp Source 07/14/23 1156 Oral     SpO2 07/14/23 1156 95 %     Weight --      Height --      Head Circumference --      Peak Flow --      Pain Score 07/14/23 1158 0     Pain Loc --      Pain Education --      Exclude from Growth Chart --    No data found.  Updated Vital Signs BP 123/76 (BP Location: Right Arm)   Pulse 70   Temp 98.8 F (37.1 C) (Oral)   Resp 16   SpO2 95%   Visual Acuity Right Eye Distance:   Left Eye Distance:   Bilateral Distance:    Right Eye Near:   Left Eye Near:    Bilateral Near:     Physical Exam Vitals and nursing note reviewed.  Constitutional:      Appearance: He is not ill-appearing or toxic-appearing.  HENT:     Head: Normocephalic and atraumatic.     Right Ear: Hearing, tympanic membrane, ear canal and external ear normal.     Left Ear: Ear canal and external ear normal. Decreased hearing noted. Tenderness present. Tympanic membrane is  erythematous and bulging. Tympanic membrane is not scarred or perforated.     Nose: Nose normal.     Mouth/Throat:     Lips: Pink.     Mouth: Mucous membranes are moist. No injury or oral lesions.     Dentition: Normal dentition.     Tongue: No lesions.     Pharynx: Oropharynx is clear. Uvula midline. No pharyngeal swelling, oropharyngeal exudate, posterior oropharyngeal  erythema, uvula swelling or postnasal drip.     Tonsils: No tonsillar exudate.  Eyes:     General: Lids are normal. Vision grossly intact. Gaze aligned appropriately.     Extraocular Movements: Extraocular movements intact.     Conjunctiva/sclera: Conjunctivae normal.  Neck:     Trachea: Trachea and phonation normal.  Cardiovascular:     Rate and Rhythm: Normal rate and regular rhythm.     Heart sounds: Normal heart sounds, S1 normal and S2 normal.  Pulmonary:     Effort: Pulmonary effort is normal. No respiratory distress.     Breath sounds: Normal breath sounds and air entry.  Musculoskeletal:     Cervical back: Neck supple.  Lymphadenopathy:     Cervical: No cervical adenopathy.  Skin:    General: Skin is warm and dry.     Capillary Refill: Capillary refill takes less than 2 seconds.     Findings: No rash.  Neurological:     General: No focal deficit present.     Mental Status: He is alert and oriented to person, place, and time. Mental status is at baseline.     Cranial Nerves: No dysarthria or facial asymmetry.  Psychiatric:        Mood and Affect: Mood normal.        Speech: Speech normal.        Behavior: Behavior normal.        Thought Content: Thought content normal.        Judgment: Judgment normal.      UC Treatments / Results  Labs (all labs ordered are listed, but only abnormal results are displayed) Labs Reviewed - No data to display  EKG   Radiology No results found.  Procedures Procedures (including critical care time)  Medications Ordered in UC Medications - No data to  display  Initial Impression / Assessment and Plan / UC Course  I have reviewed the triage vital signs and the nursing notes.  Pertinent labs & imaging results that were available during my care of the patient were reviewed by me and considered in my medical decision making (see chart for details).   1.  Recurrent acute suppurative otitis media without spontaneous rupture of left tympanic membrane, viral URI with cough Presentation consistent with viral illness that will likely improve in the next several days.  Lungs clear, therefore deferred imaging of the chest.  Vital signs are hemodynamically stable. Left ear remains infected. Recently treated with Augmentin for ear infection, therefore will treat with cefdinir twice daily for 10 days. May use Flonase to reduce eustachian tube dysfunction and swelling to the TM. Tylenol/Motrin as needed.  Promethazine DM at bedtime as needed for cough.  He may benefit from further workup and evaluation with ear nose and throat should symptoms persist for follow-up regarding recurrent ear infections after viral illnesses.  Counseled patient on potential for adverse effects with medications prescribed/recommended today, strict ER and return-to-clinic precautions discussed, patient verbalized understanding.    Final Clinical Impressions(s) / UC Diagnoses   Final diagnoses:  Recurrent acute suppurative otitis media without spontaneous rupture of left tympanic membrane  Viral URI with cough     Discharge Instructions      You have a viral upper respiratory tract infection. You also have a left ear infection that has been persistent.  Take cefdinir antibiotic every 12 hours for the next 10 days to treat left ear infection.  Use Flonase nasal spray 2 puffs into each nostril once  daily for the next 3 weeks to reduce swelling and tenderness to the ears and eustachian tubes.  You may continue taking Promethazine DM at bedtime as needed for  coughing.  Mucinex may help with nasal congestion and cough as well.  Lungs are clear today, however if you spike a fever or if you develop chest pain or shortness of breath, I would like for you to return to the clinic and we will do a chest x-ray at that time.  If you develop any new or worsening symptoms or if your symptoms do not start to improve, please return here or follow-up with your primary care provider. If your symptoms are severe, please go to the emergency room.     ED Prescriptions     Medication Sig Dispense Auth. Provider   cefdinir (OMNICEF) 300 MG capsule Take 1 capsule (300 mg total) by mouth 2 (two) times daily for 10 days. 20 capsule Reita May M, FNP   fluticasone [facility]) 50 MCG/ACT nasal spray Place 2 sprays into both nostrils daily. 16 g Reita May M, FNP   promethazine-dextromethorphan (PROMETHAZINE-DM) 6.25-15 MG/5ML syrup Take 5 mLs by mouth at bedtime as needed for cough. 118 mL Carlisle Beers, FNP      PDMP not reviewed this encounter.   Carlisle Beers, [place] 07/14/23 1250

## 2023-07-14 NOTE — ED Triage Notes (Addendum)
Pt c/o congestion, sore throat, and lots of mucous since Monday. Denies fever.

## 2023-08-10 IMAGING — DX DG SHOULDER 2+V*L*
3 series · 3 of 3 positions shown · non-contrast
Comparison: None.

CLINICAL DATA: Left shoulder pain for 2 days without trauma

EXAM:
LEFT SHOULDER - 2+ VIEW

[shoulder neutral ap]
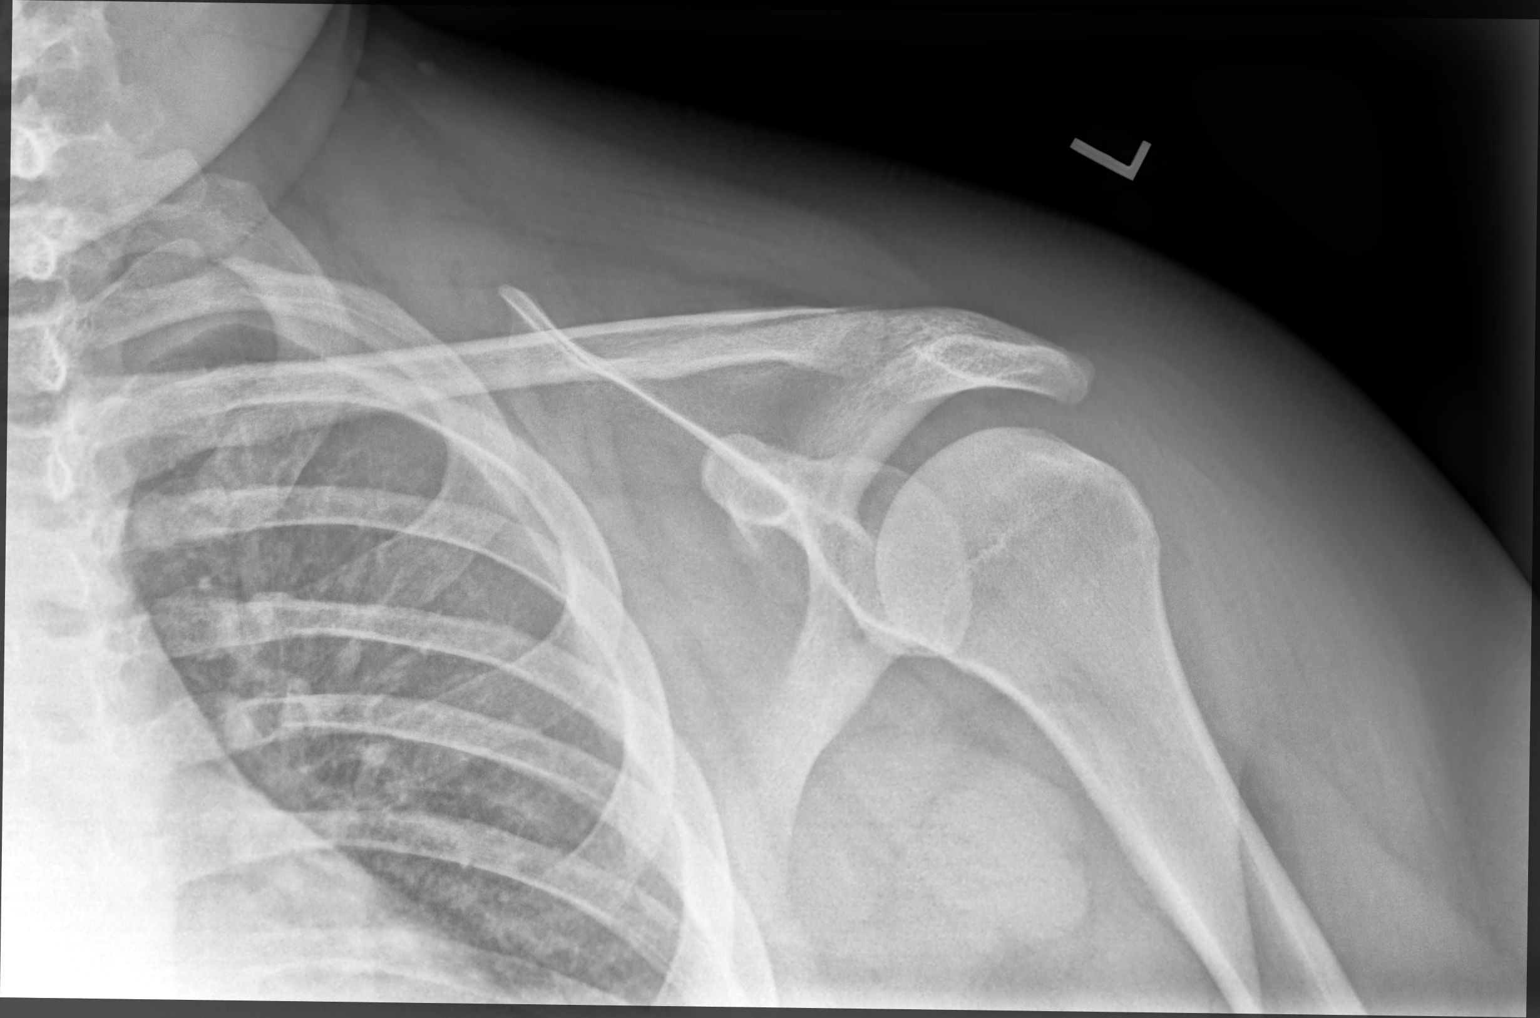

[grashey]
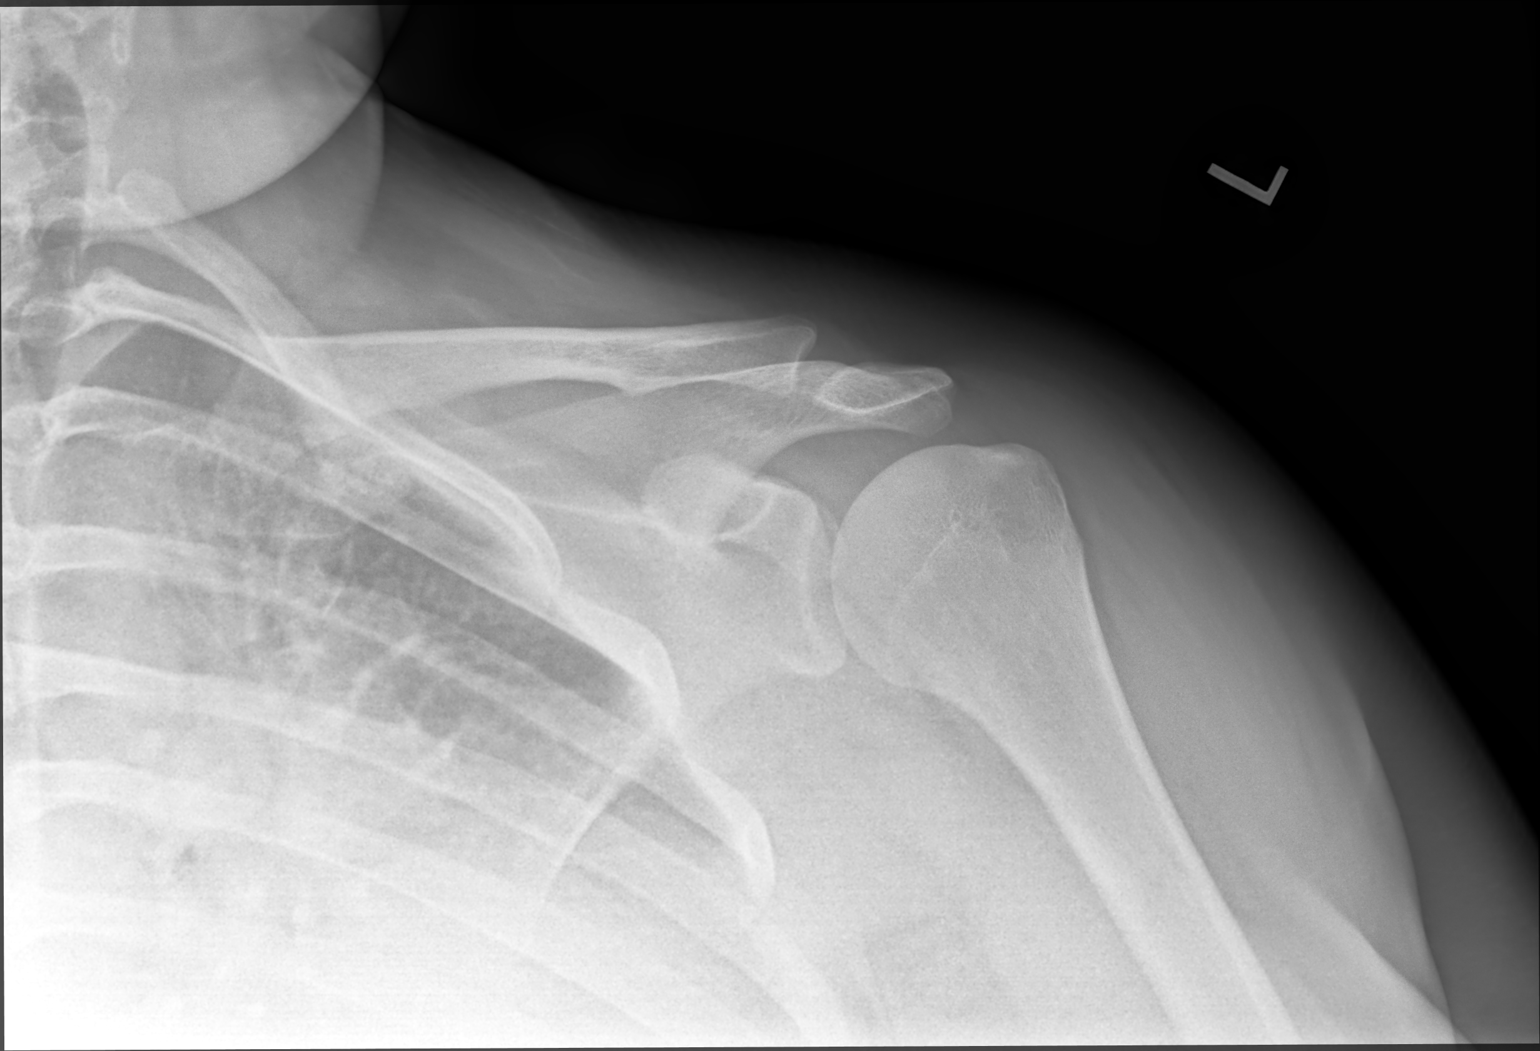

[shoulder transscapular y view (neer)]
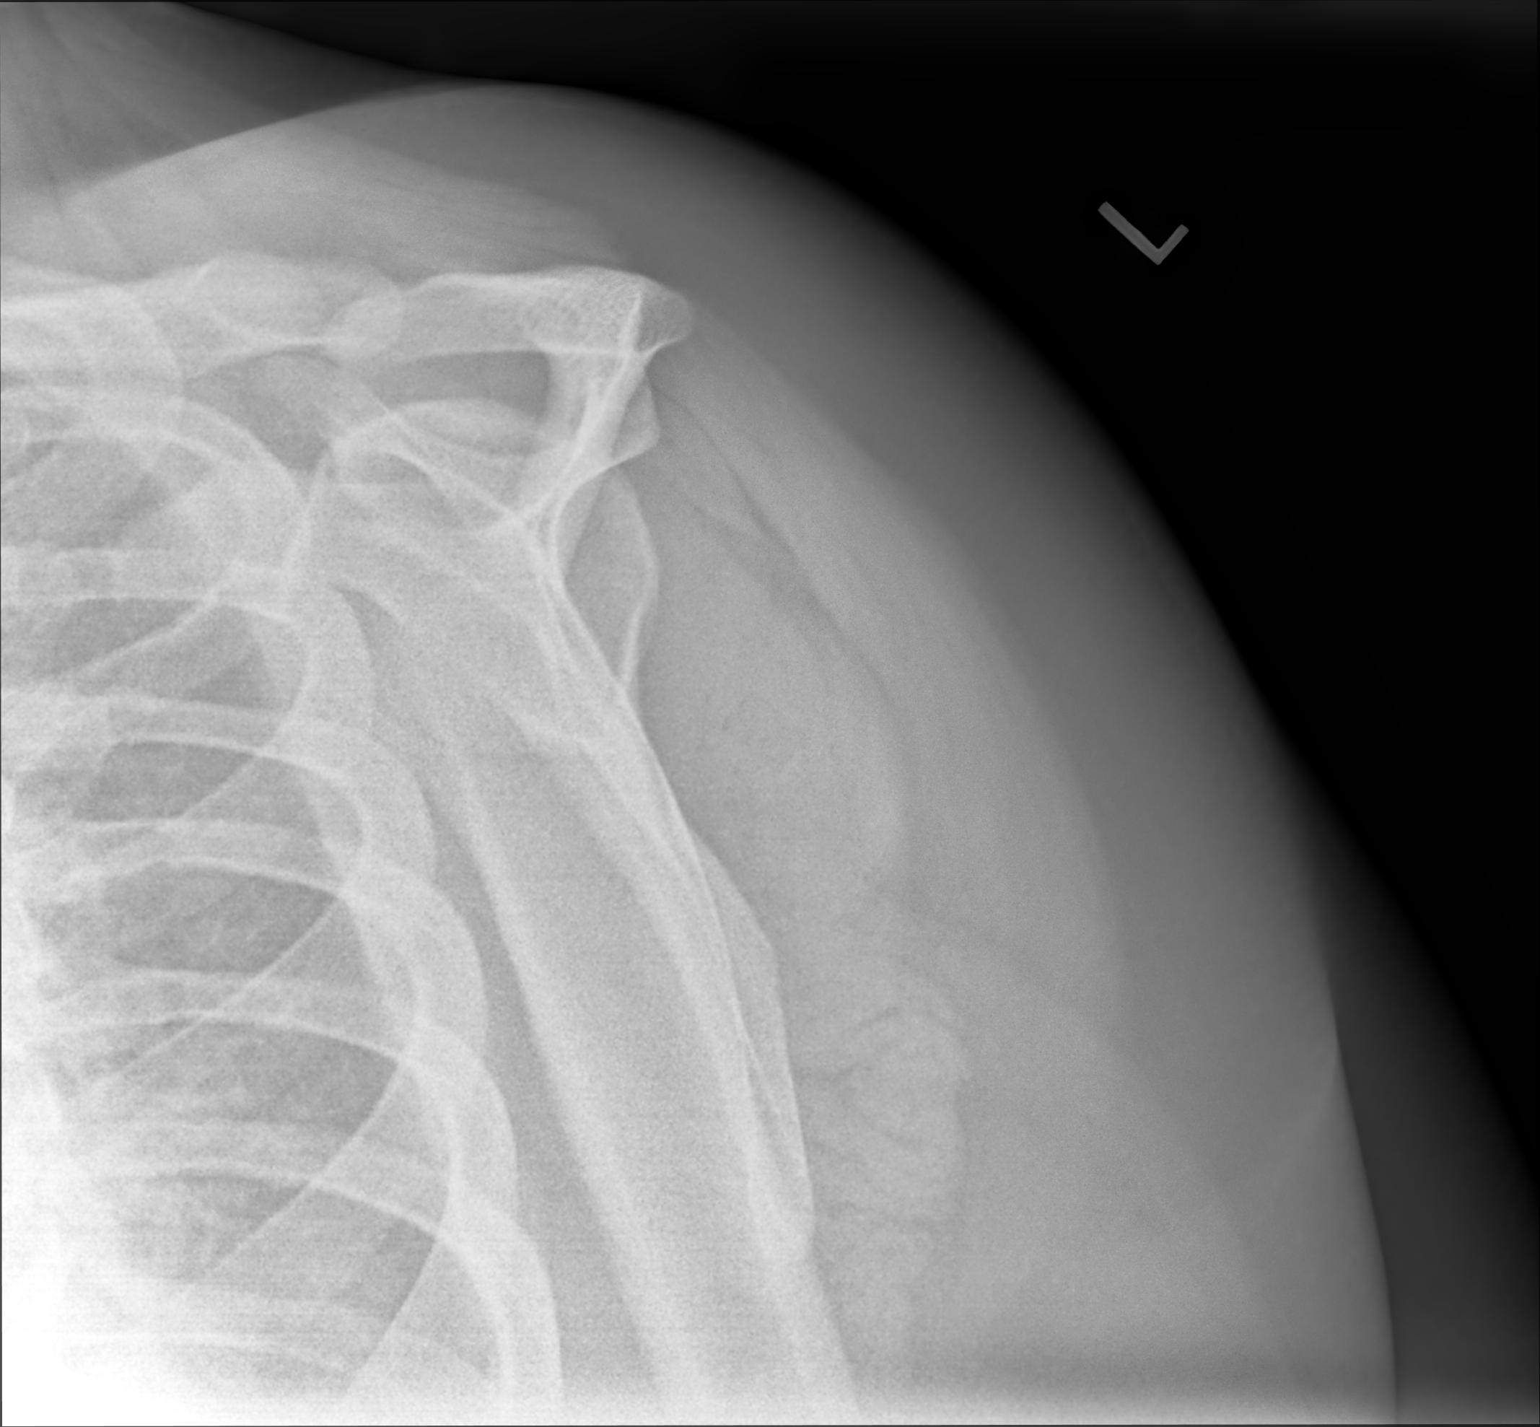

[3 of 3 positions shown; findings below may reference images not displayed]

FINDINGS: Visualized portion of the left hemithorax is normal. No acute
fracture or dislocation. No focal osseous lesion.
IMPRESSION: Normal left shoulder.

## 2024-01-19 ENCOUNTER — Other Ambulatory Visit: Payer: Self-pay | Admitting: Family Medicine

## 2024-01-19 DIAGNOSIS — F40243 Fear of flying: Secondary | ICD-10-CM

## 2024-01-20 ENCOUNTER — Other Ambulatory Visit (HOSPITAL_COMMUNITY): Payer: Self-pay

## 2024-01-20 MED ORDER — DIAZEPAM 5 MG PO TABS
5.0000 mg | ORAL_TABLET | Freq: Three times a day (TID) | ORAL | 0 refills | Status: DC | PRN
Start: 2024-01-20 — End: 2024-02-20
  Filled 2024-01-20: qty 15, 5d supply, fill #0

## 2024-02-10 ENCOUNTER — Emergency Department (HOSPITAL_COMMUNITY): Admitting: Anesthesiology

## 2024-02-10 ENCOUNTER — Emergency Department (HOSPITAL_BASED_OUTPATIENT_CLINIC_OR_DEPARTMENT_OTHER)

## 2024-02-10 ENCOUNTER — Ambulatory Visit (HOSPITAL_BASED_OUTPATIENT_CLINIC_OR_DEPARTMENT_OTHER)
Admission: EM | Admit: 2024-02-10 | Discharge: 2024-02-10 | Disposition: A | Discharge status: Home / Self Care | Attending: General Surgery | Admitting: General Surgery

## 2024-02-10 ENCOUNTER — Encounter (HOSPITAL_BASED_OUTPATIENT_CLINIC_OR_DEPARTMENT_OTHER): Payer: Self-pay | Admitting: Emergency Medicine

## 2024-02-10 ENCOUNTER — Encounter (HOSPITAL_COMMUNITY)
Admission: EM | Disposition: A | Discharge status: Home / Self Care | Payer: Self-pay | Source: Home / Self Care | Attending: Emergency Medicine

## 2024-02-10 ENCOUNTER — Other Ambulatory Visit: Payer: Self-pay

## 2024-02-10 DIAGNOSIS — Z6835 Body mass index (BMI) 35.0-35.9, adult: Secondary | ICD-10-CM | POA: Diagnosis not present

## 2024-02-10 DIAGNOSIS — K3580 Unspecified acute appendicitis: Secondary | ICD-10-CM | POA: Insufficient documentation

## 2024-02-10 DIAGNOSIS — E669 Obesity, unspecified: Secondary | ICD-10-CM | POA: Diagnosis not present

## 2024-02-10 DIAGNOSIS — Z743 Need for continuous supervision: Secondary | ICD-10-CM | POA: Diagnosis not present

## 2024-02-10 DIAGNOSIS — R1031 Right lower quadrant pain: Secondary | ICD-10-CM | POA: Diagnosis not present

## 2024-02-10 HISTORY — PX: LAPAROSCOPIC APPENDECTOMY: SHX408

## 2024-02-10 LAB — CBC WITH DIFFERENTIAL/PLATELET
Abs Immature Granulocytes: 0.03 K/uL (ref 0.00–0.07)
Basophils Absolute: 0 K/uL (ref 0.0–0.1)
Basophils Relative: 0 %
Eosinophils Absolute: 0.1 K/uL (ref 0.0–0.5)
Eosinophils Relative: 1 %
HCT: 43.7 % (ref 39.0–52.0)
Hemoglobin: 15.1 g/dL (ref 13.0–17.0)
Immature Granulocytes: 0 %
Lymphocytes Relative: 19 %
Lymphs Abs: 2.1 K/uL (ref 0.7–4.0)
MCH: 29.5 pg (ref 26.0–34.0)
MCHC: 34.6 g/dL (ref 30.0–36.0)
MCV: 85.4 fL (ref 80.0–100.0)
Monocytes Absolute: 0.7 K/uL (ref 0.1–1.0)
Monocytes Relative: 6 %
Neutro Abs: 8.5 K/uL — ABNORMAL HIGH (ref 1.7–7.7)
Neutrophils Relative %: 74 %
Platelets: 251 K/uL (ref 150–400)
RBC: 5.12 MIL/uL (ref 4.22–5.81)
RDW: 13.1 % (ref 11.5–15.5)
WBC: 11.5 K/uL — ABNORMAL HIGH (ref 4.0–10.5)
nRBC: 0 % (ref 0.0–0.2)

## 2024-02-10 LAB — URINALYSIS, ROUTINE W REFLEX MICROSCOPIC
Bilirubin Urine: NEGATIVE
Glucose, UA: NEGATIVE mg/dL
Hgb urine dipstick: NEGATIVE
Ketones, ur: NEGATIVE mg/dL
Leukocytes,Ua: NEGATIVE
Nitrite: NEGATIVE
Protein, ur: NEGATIVE mg/dL
Specific Gravity, Urine: 1.025 (ref 1.005–1.030)
pH: 6 (ref 5.0–8.0)

## 2024-02-10 LAB — COMPREHENSIVE METABOLIC PANEL WITH GFR
ALT: 17 U/L (ref 0–44)
AST: 17 U/L (ref 15–41)
Albumin: 4.7 g/dL (ref 3.5–5.0)
Alkaline Phosphatase: 38 U/L (ref 38–126)
Anion gap: 12 (ref 5–15)
BUN: 16 mg/dL (ref 6–20)
CO2: 25 mmol/L (ref 22–32)
Calcium: 9.6 mg/dL (ref 8.9–10.3)
Chloride: 103 mmol/L (ref 98–111)
Creatinine, Ser: 0.78 mg/dL (ref 0.61–1.24)
GFR, Estimated: >60 mL/min (ref >60)
Glucose, Bld: 105 mg/dL — ABNORMAL HIGH (ref 70–99)
Potassium: 3.8 mmol/L (ref 3.5–5.1)
Sodium: 140 mmol/L (ref 135–145)
Total Bilirubin: 0.7 mg/dL (ref 0.0–1.2)
Total Protein: 7.7 g/dL (ref 6.5–8.1)

## 2024-02-10 LAB — LIPASE, BLOOD: Lipase: 37 U/L (ref 11–51)

## 2024-02-10 SURGERY — APPENDECTOMY, LAPAROSCOPIC
Anesthesia: General

## 2024-02-10 MED ORDER — FENTANYL CITRATE PF 50 MCG/ML IJ SOSY: 25.0000 ug | PREFILLED_SYRINGE | INTRAMUSCULAR | Status: DC | PRN

## 2024-02-10 MED ORDER — SUGAMMADEX SODIUM 200 MG/2ML IV SOLN
INTRAVENOUS | Status: AC
  Filled 2024-02-10: qty 12

## 2024-02-10 MED ORDER — SODIUM CHLORIDE 0.9 % IV SOLN
2.0000 g | Freq: Once | INTRAVENOUS | Status: AC
  Administered 2024-02-10: 2 g via INTRAVENOUS
  Filled 2024-02-10: qty 20

## 2024-02-10 MED ORDER — EPHEDRINE 5 MG/ML INJ
INTRAVENOUS | Status: AC
  Filled 2024-02-10: qty 15

## 2024-02-10 MED ORDER — KETOROLAC TROMETHAMINE 30 MG/ML IJ SOLN
INTRAMUSCULAR | Status: DC | PRN
  Administered 2024-02-10: 30 mg via INTRAVENOUS

## 2024-02-10 MED ORDER — LIDOCAINE HCL (PF) 1 % IJ SOLN
INTRAMUSCULAR | Status: DC | PRN
  Administered 2024-02-10: 5 mL

## 2024-02-10 MED ORDER — FENTANYL CITRATE (PF) 250 MCG/5ML IJ SOLN
INTRAMUSCULAR | Status: AC
  Filled 2024-02-10: qty 5

## 2024-02-10 MED ORDER — OXYCODONE HCL 5 MG/5ML PO SOLN: 5.0000 mg | Freq: Once | ORAL | Status: DC | PRN

## 2024-02-10 MED ORDER — FENTANYL CITRATE (PF) 100 MCG/2ML IJ SOLN
INTRAMUSCULAR | Status: DC | PRN
  Administered 2024-02-10: 50 ug via INTRAVENOUS
  Administered 2024-02-10 (×2): 100 ug via INTRAVENOUS

## 2024-02-10 MED ORDER — OXYCODONE HCL 5 MG PO TABS: 5.0000 mg | ORAL_TABLET | Freq: Four times a day (QID) | ORAL | 0 refills | Status: DC | PRN

## 2024-02-10 MED ORDER — KETOROLAC TROMETHAMINE 15 MG/ML IJ SOLN
15.0000 mg | Freq: Once | INTRAMUSCULAR | Status: AC
  Administered 2024-02-10: 15 mg via INTRAVENOUS
  Filled 2024-02-10: qty 1

## 2024-02-10 MED ORDER — ONDANSETRON HCL 4 MG/2ML IJ SOLN
INTRAMUSCULAR | Status: AC
  Filled 2024-02-10: qty 2

## 2024-02-10 MED ORDER — ACETAMINOPHEN 500 MG PO TABS: 1000.0000 mg | ORAL_TABLET | ORAL | Status: DC

## 2024-02-10 MED ORDER — OXYCODONE HCL 5 MG PO TABS: 5.0000 mg | ORAL_TABLET | Freq: Once | ORAL | Status: DC | PRN

## 2024-02-10 MED ORDER — LIDOCAINE HCL 1 % IJ SOLN
INTRAMUSCULAR | Status: AC
  Filled 2024-02-10: qty 20

## 2024-02-10 MED ORDER — MIDAZOLAM HCL 5 MG/5ML IJ SOLN
INTRAMUSCULAR | Status: DC | PRN
  Administered 2024-02-10 (×2): 1 mg via INTRAVENOUS

## 2024-02-10 MED ORDER — BUPIVACAINE-EPINEPHRINE 0.25% -1:200000 IJ SOLN
INTRAMUSCULAR | Status: DC | PRN
  Administered 2024-02-10: 5 mL

## 2024-02-10 MED ORDER — ROCURONIUM BROMIDE 100 MG/10ML IV SOLN
INTRAVENOUS | Status: DC | PRN
  Administered 2024-02-10: 80 mg via INTRAVENOUS

## 2024-02-10 MED ORDER — ACETAMINOPHEN 500 MG PO TABS
ORAL_TABLET | ORAL | Status: AC
  Filled 2024-02-10: qty 2

## 2024-02-10 MED ORDER — AMISULPRIDE (ANTIEMETIC) 5 MG/2ML IV SOLN: 10.0000 mg | Freq: Once | INTRAVENOUS | Status: DC | PRN

## 2024-02-10 MED ORDER — LABETALOL HCL 5 MG/ML IV SOLN
INTRAVENOUS | Status: AC
  Filled 2024-02-10: qty 4

## 2024-02-10 MED ORDER — KETOROLAC TROMETHAMINE 30 MG/ML IJ SOLN: 30.0000 mg | Freq: Once | INTRAMUSCULAR | Status: DC | PRN

## 2024-02-10 MED ORDER — ROCURONIUM BROMIDE 10 MG/ML (PF) SYRINGE
PREFILLED_SYRINGE | INTRAVENOUS | Status: AC
Start: 2024-02-10 — End: 2024-02-10
  Filled 2024-02-10: qty 10

## 2024-02-10 MED ORDER — PROPOFOL 10 MG/ML IV BOLUS
INTRAVENOUS | Status: AC
  Filled 2024-02-10: qty 20

## 2024-02-10 MED ORDER — SUGAMMADEX SODIUM 200 MG/2ML IV SOLN
INTRAVENOUS | Status: DC | PRN
  Administered 2024-02-10: 200 mg via INTRAVENOUS
  Administered 2024-02-10: 60 mg via INTRAVENOUS

## 2024-02-10 MED ORDER — MORPHINE SULFATE (PF) 4 MG/ML IV SOLN
4.0000 mg | INTRAVENOUS | Status: DC | PRN
  Administered 2024-02-10: 4 mg via INTRAVENOUS
  Filled 2024-02-10: qty 1

## 2024-02-10 MED ORDER — DEXAMETHASONE SODIUM PHOSPHATE 10 MG/ML IJ SOLN
INTRAMUSCULAR | Status: AC
  Filled 2024-02-10: qty 1

## 2024-02-10 MED ORDER — LACTATED RINGERS IV SOLN: INTRAVENOUS | Status: DC

## 2024-02-10 MED ORDER — KETOROLAC TROMETHAMINE 30 MG/ML IJ SOLN
INTRAMUSCULAR | Status: AC
  Filled 2024-02-10: qty 1

## 2024-02-10 MED ORDER — METRONIDAZOLE 500 MG/100ML IV SOLN
500.0000 mg | Freq: Once | INTRAVENOUS | Status: AC
  Administered 2024-02-10: 500 mg via INTRAVENOUS
  Filled 2024-02-10: qty 100

## 2024-02-10 MED ORDER — ACETAMINOPHEN 10 MG/ML IV SOLN
INTRAVENOUS | Status: AC
  Filled 2024-02-10: qty 100

## 2024-02-10 MED ORDER — DEXAMETHASONE SODIUM PHOSPHATE 10 MG/ML IJ SOLN
INTRAMUSCULAR | Status: DC | PRN
  Administered 2024-02-10: 10 mg via INTRAVENOUS

## 2024-02-10 MED ORDER — MIDAZOLAM HCL 2 MG/2ML IJ SOLN
INTRAMUSCULAR | Status: AC
  Filled 2024-02-10: qty 2

## 2024-02-10 MED ORDER — PHENYLEPHRINE 80 MCG/ML (10ML) SYRINGE FOR IV PUSH (FOR BLOOD PRESSURE SUPPORT)
PREFILLED_SYRINGE | INTRAVENOUS | Status: AC
  Filled 2024-02-10: qty 50

## 2024-02-10 MED ORDER — ONDANSETRON HCL 4 MG/2ML IJ SOLN
INTRAMUSCULAR | Status: DC | PRN
  Administered 2024-02-10: 4 mg via INTRAVENOUS

## 2024-02-10 MED ORDER — IOHEXOL 300 MG/ML  SOLN
100.0000 mL | Freq: Once | INTRAMUSCULAR | Status: AC | PRN
  Administered 2024-02-10: 100 mL via INTRAVENOUS

## 2024-02-10 MED ORDER — ONDANSETRON 4 MG PO TBDP: 4.0000 mg | ORAL_TABLET | Freq: Three times a day (TID) | ORAL | 0 refills | Status: AC | PRN

## 2024-02-10 MED ORDER — BUPIVACAINE-EPINEPHRINE (PF) 0.25% -1:200000 IJ SOLN
INTRAMUSCULAR | Status: AC
  Filled 2024-02-10: qty 30

## 2024-02-10 MED ORDER — PROPOFOL 10 MG/ML IV BOLUS
INTRAVENOUS | Status: DC | PRN
  Administered 2024-02-10: 200 mg via INTRAVENOUS

## 2024-02-10 MED ORDER — ACETAMINOPHEN 10 MG/ML IV SOLN
1000.0000 mg | Freq: Once | INTRAVENOUS | Status: DC | PRN
  Administered 2024-02-10: 1000 mg via INTRAVENOUS

## 2024-02-10 SURGICAL SUPPLY — 31 items
BAG COUNTER SPONGE SURGICOUNT (BAG) IMPLANT
CABLE HIGH FREQUENCY MONO STRZ (ELECTRODE) ×1 IMPLANT
CLIP APPLIE ROT 10 11.4 M/L (STAPLE) IMPLANT
COVER SURGICAL LIGHT HANDLE (MISCELLANEOUS) ×1 IMPLANT
CUTTER FLEX LINEAR 45M (STAPLE) IMPLANT
DERMABOND ADVANCED .7 DNX12 (GAUZE/BANDAGES/DRESSINGS) ×1 IMPLANT
DRAPE LAPAROSCOPIC ABDOMINAL (DRAPES) ×1 IMPLANT
ELECT REM PT RETURN 15FT ADLT (MISCELLANEOUS) ×1 IMPLANT
ENDOLOOP SUT PDS II 0 18 (SUTURE) IMPLANT
GLOVE BIO SURGEON STRL SZ 6 (GLOVE) ×1 IMPLANT
GLOVE INDICATOR 6.5 STRL GRN (GLOVE) ×1 IMPLANT
GOWN STRL REUS W/ TWL XL LVL3 (GOWN DISPOSABLE) ×1 IMPLANT
IRRIGATION SUCT STRKRFLW 2 WTP (MISCELLANEOUS) ×1 IMPLANT
KIT BASIN OR (CUSTOM PROCEDURE TRAY) ×1 IMPLANT
KIT TURNOVER KIT A (KITS) ×1 IMPLANT
PENCIL SMOKE EVACUATOR (MISCELLANEOUS) IMPLANT
RELOAD STAPLE 45 2.5 WHT GRN (ENDOMECHANICALS) IMPLANT
RELOAD STAPLE 45 3.5 BLU ETS (ENDOMECHANICALS) IMPLANT
SCISSORS LAP 5X35 DISP (ENDOMECHANICALS) IMPLANT
SET TUBE SMOKE EVAC HIGH FLOW (TUBING) ×1 IMPLANT
SHEARS HARMONIC 36 ACE (MISCELLANEOUS) ×1 IMPLANT
SLEEVE Z-THREAD 5X100MM (TROCAR) ×1 IMPLANT
SPIKE FLUID TRANSFER (MISCELLANEOUS) ×1 IMPLANT
SUT MNCRL AB 4-0 PS2 18 (SUTURE) ×1 IMPLANT
SUT VICRYL 0 UR6 27IN ABS (SUTURE) ×1 IMPLANT
SYSTEM BAG RETRIEVAL 10MM (BASKET) ×1 IMPLANT
TOWEL OR 17X26 10 PK STRL BLUE (TOWEL DISPOSABLE) ×1 IMPLANT
TRAY FOLEY MTR SLVR 16FR STAT (SET/KITS/TRAYS/PACK) IMPLANT
TRAY LAPAROSCOPIC (CUSTOM PROCEDURE TRAY) ×1 IMPLANT
TROCAR BALLN 12MMX100 BLUNT (TROCAR) ×1 IMPLANT
TROCAR Z-THREAD OPTICAL 5X100M (TROCAR) ×1 IMPLANT

## 2024-02-10 NOTE — ED Notes (Signed)
 Patient arrived from [place] by [facility].

## 2024-02-10 NOTE — ED Notes (Signed)
 Pt to go straight to OR when transported to [facility]. [facility] at bedside for transport.

## 2024-02-10 NOTE — Op Note (Signed)
 Appendectomy, Lap, Procedure Note  Indications: The patient presented with a history of right-sided abdominal pain. A CT revealed findings consistent with acute appendicitis.  Pre-operative Diagnosis: acute appendicitis  Post-operative Diagnosis: Same  Surgeon: Jina Nephew   Assistants: n/a  Anesthesia: General endotracheal anesthesia and Local anesthesia 1% plain lidocaine , 0.25.% bupivacaine , with epinephrine   ASA Class: 1  Procedure Details  The patient was seen again in the Holding Room. The risks, benefits, complications, treatment options, and expected outcomes were discussed with the patient and/or family. The possibilities of perforation of viscus, bleeding, recurrent infection, the need for additional procedures, failure to diagnose a condition, and creating a complication requiring transfusion or operation were discussed. There was concurrence with the proposed plan and informed consent was obtained. The site of surgery was properly noted. The patient was taken to Operating Room, identified as [facility] and the procedure verified as Appendectomy. A Time Out was held and the above information confirmed.  The patient was placed in the supine position and general anesthesia was induced, along with placement of orogastric tube, Venodyne boots, and a Foley catheter. The abdomen was prepped and draped in a sterile fashion. Local anesthetic was infiltrated in the infraumbilical region.  A 1.5 cm vertical incision was made just below the umbilicus.  The Kelly clamp was used to spread the subcutaneous tissues.  The fascia was elevated with 2 Kocher clamps and incised with the #11 blade.  A Burnard was used to confirm entrance into the peritoneal cavity.  A pursestring suture was placed around the fascial incision.  The Hasson trocar was inserted into the abdomen and held in place with the tails of the suture.  The pneumoperitoneum was then established to steady pressure of 15 mmHg.      Additional 5 mm cannulas then placed in the left lower quadrant of the abdomen and the suprapubic region under direct visualization.  A careful evaluation of the entire abdomen was carried out. The patient was placed in Trendelenburg and rotated to the left.  The small intestines were retracted in the cephalad and left lateral direction away from the pelvis and right lower quadrant. The terminal ileum initially required some sharp dissection with cold scissors from the pelvic sidewall in order to locate the appendix.  The patient was then found to have an enlarged and inflamed appendix in the retrocecal position. There was no evidence of perforation.  The appendix was carefully dissected. The appendix was was skeletonized with the harmonic scalpel.   The appendix was divided at its base using an endo-GIA stapler. Minimal appendiceal stump was left in place. The appendix was removed from the abdomen with an Endocatch bag through the umbilical port.  There was no evidence of bleeding, leakage, or complication after division of the appendix. Irrigation was also performed and irrigate suctioned from the abdomen as well.  The 5 mm trocars were removed.  The pneumoperitoneum was evacuated from the abdomen.    The trocar site skin wounds were closed with 4-0 Monocryl and dressed with Dermabond.  Instrument, sponge, and needle counts were correct at the conclusion of the case.   Findings: The appendix was found to be inflamed. There were not signs of necrosis.  There was not perforation. There was not abscess formation.  Estimated Blood Loss:  Minimal         Drains: none          Specimens: appendix to pathology         Complications:  None; patient tolerated the procedure well.         Disposition: PACU - hemodynamically stable.         Condition: stable

## 2024-02-10 NOTE — H&P (Signed)
 Donald Brooks 11/16/2000  [id number].    Chief Complaint/Reason for Consult: acute appendicitis with appendicolith  HPI:  This is a 23 yo male with a history of obesity and HLD who presents to [facility] ED with pain that began periumbilically last night.  He has not has any nausea, vomiting, or diarrhea.  He denies any fevers, chest pain, SOB, etc.  His pain has continued throughout the night and worsened.  He presented to the ED for evaluation.  He has been found to have a WBC of 11 and a CT scan concerning for appendicitis with an appendicolith.  We have been asked to see him.  ROS: ROS: see HPI  Family History  Problem Relation Age of Onset   Diabetes Mother    Hyperlipidemia Mother    Hypothyroidism Mother    Diabetes Father    Hypertension Father    Birth defects Son    Cancer Maternal Grandmother        thyroid    Sudden death Neg Hx    Heart attack Neg Hx     Past Medical History:  Diagnosis Date   Abscess    Acanthosis nigricans 02/13/2018   Allergy    Chronic diarrhea 12/26/2019   Elevated triglycerides with high cholesterol 12/26/2019   Epistaxis 04/27/2017   Inattention 11/23/2017   Left leg pain 06/27/2018   Low HDL (under 40) 02/13/2018   Lower leg mass, left 11/23/2017   Nausea 12/26/2019   Obesity (BMI 30-39.9) 08/09/2022   Severe obesity due to excess calories without serious comorbidity with body mass index (BMI) greater than 99th percentile for age in pediatric patient (HCC) 11/23/2017    Past Surgical History:  Procedure Laterality Date   FRACTURE SURGERY Left    fracture left wrist 2006   HYDROCELE EXCISION / REPAIR     TONSILLECTOMY AND ADENOIDECTOMY      Social History:  reports that he has never smoked. He has never used smokeless tobacco. He reports that he does not drink alcohol and does not use drugs.  Allergies:  Allergies  Allergen Reactions   Sulfa Antibiotics Swelling and Rash   Sulfamethoxazole Rash    (Not in a hospital  admission)    Physical Exam: Blood pressure (!) 140/71, pulse (!) 55, temperature 98.8 F (37.1 C), temperature source Oral, resp. rate 16, height 6' 4 (1.93 m), weight 131.5 kg, SpO2 98%.  General: pleasant, WD, WN, obese male who is laying in bed in NAD HEENT: head is normocephalic, atraumatic.  Sclera are noninjected.  PERRL.  Ears and nose without any masses or lesions.  Mouth is pink and moist Heart: regular, rate, and rhythm.  Normal s1,s2. No obvious murmurs, gallops, or rubs noted.  Palpable radial and pedal pulses bilaterally Lungs: CTAB, no wheezes, rhonchi, or rales noted.  Respiratory effort nonlabored Abd: soft, tender in RLQ, ND, +BS, no masses, hernias, or organomegaly MS: all 4 extremities are symmetrical with no cyanosis, clubbing, or edema. Skin: warm and dry with no masses, lesions, or rashes Neuro: Cranial nerves 2-12 grossly intact, sensation is normal throughout Psych: A&Ox3 with an appropriate affect.   Results for orders placed or performed during the hospital encounter of 02/10/24 (from the past 48 hours)  Urinalysis, Routine w reflex microscopic -Urine, Clean Catch     Status: None   Collection Time: 02/10/24  9:44 AM  Result Value Ref Range   Color, Urine YELLOW YELLOW   APPearance CLEAR CLEAR   Specific Gravity, Urine  1.025 1.005 - 1.030   pH 6.0 5.0 - 8.0   Glucose, UA NEGATIVE NEGATIVE mg/dL   Hgb urine dipstick NEGATIVE NEGATIVE   Bilirubin Urine NEGATIVE NEGATIVE   Ketones, ur NEGATIVE NEGATIVE mg/dL   Protein, ur NEGATIVE NEGATIVE mg/dL   Nitrite NEGATIVE NEGATIVE   Leukocytes,Ua NEGATIVE NEGATIVE    Comment: Microscopic not done on urines with negative protein, blood, leukocytes, nitrite, or glucose < 500 mg/dL. Performed at [facility] [address] [place], [place] [address]   Comprehensive metabolic panel     Status: Abnormal   Collection Time: 02/10/24  9:53 AM  Result Value Ref Range   Sodium 140 135 - 145 mmol/L    Potassium 3.8 3.5 - 5.1 mmol/L   Chloride 103 98 - 111 mmol/L   CO2 25 22 - 32 mmol/L   Glucose, Bld 105 (H) 70 - 99 mg/dL    Comment: Glucose reference range applies only to samples taken after fasting for at least 8 hours.   BUN 16 6 - 20 mg/dL   Creatinine, Ser 9.21 0.61 - 1.24 mg/dL   Calcium 9.6 8.9 - 89.6 mg/dL   Total Protein 7.7 6.5 - 8.1 g/dL   Albumin 4.7 3.5 - 5.0 g/dL   AST 17 15 - 41 U/L   ALT 17 0 - 44 U/L   Alkaline Phosphatase 38 38 - 126 U/L   Total Bilirubin 0.7 0.0 - 1.2 mg/dL   GFR, Estimated >39 >39 mL/min    Comment: (NOTE) Calculated using the CKD-EPI Creatinine Equation (2021)    Anion gap 12 5 - 15    Comment: Performed at [facility], [address] [place], [place] [address]  Lipase, blood     Status: None   Collection Time: 02/10/24  9:53 AM  Result Value Ref Range   Lipase 37 11 - 51 U/L    Comment: Performed at [facility], [address] [place], [place] [address]  CBC with Diff     Status: Abnormal   Collection Time: 02/10/24  9:53 AM  Result Value Ref Range   WBC 11.5 (H) 4.0 - 10.5 K/uL   RBC 5.12 4.22 - 5.81 MIL/uL   Hemoglobin 15.1 13.0 - 17.0 g/dL   HCT 56.2 60.9 - 47.9 %   MCV 85.4 80.0 - 100.0 fL   MCH 29.5 26.0 - 34.0 pg   MCHC 34.6 30.0 - 36.0 g/dL   RDW 86.8 88.4 - 84.4 %   Platelets 251 150 - 400 K/uL   nRBC 0.0 0.0 - 0.2 %   Neutrophils Relative % 74 %   Neutro Abs 8.5 (H) 1.7 - 7.7 K/uL   Lymphocytes Relative 19 %   Lymphs Abs 2.1 0.7 - 4.0 K/uL   Monocytes Relative 6 %   Monocytes Absolute 0.7 0.1 - 1.0 K/uL   Eosinophils Relative 1 %   Eosinophils Absolute 0.1 0.0 - 0.5 K/uL   Basophils Relative 0 %   Basophils Absolute 0.0 0.0 - 0.1 K/uL   Immature Granulocytes 0 %   Abs Immature Granulocytes 0.03 0.00 - 0.07 K/uL    Comment: Performed at [facility], [address] [place], [place] [address]   CT ABDOMEN PELVIS W CONTRAST Result Date: 02/10/2024 CLINICAL DATA:  Right lower  quadrant pain EXAM: CT ABDOMEN AND PELVIS WITH CONTRAST TECHNIQUE: Multidetector CT imaging of the abdomen and pelvis was performed using the standard protocol  following bolus administration of intravenous contrast. RADIATION DOSE REDUCTION: This exam was performed according to the departmental dose-optimization program which includes automated exposure control, adjustment of the mA and/or kV according to patient size and/or use of iterative reconstruction technique. CONTRAST:  OMNIPAQUE  IOHEXOL  300 MG/ML  SOLN COMPARISON:  Ultrasound abdomen February 10, 2024. FINDINGS: Lower chest: No acute abnormality. Hepatobiliary: No focal liver abnormality is seen. Mild hepatic steatosis. No gallstones, gallbladder wall thickening, or biliary dilatation. Pancreas: Unremarkable. No pancreatic ductal dilatation or surrounding inflammatory changes. Spleen: Normal in size without focal abnormality.  Small splenule. Adrenals/Urinary Tract: Adrenal glands are unremarkable. Kidneys are normal, without renal calculi, focal lesion, or hydronephrosis. Bladder is unremarkable. Stomach/Bowel: Appendix is thickened measuring up to 9 mm. There is an appendicolith measuring 7 mm in mid appendix. Periappendiceal fat stranding . Multiple periappendiceal prominent pericentimeter and subcentimeter nodes likely reactive. No periappendiceal fluid collection. Scattered colonic diverticula. Otherwise no evidence of bowel wall thickening, distention, or inflammatory changes.Stomach is within normal limits. Vascular/Lymphatic: No significant vascular findings are present. No enlarged abdominal or pelvic lymph nodes. Reproductive: Normal prostate. Other: No abdominal wall hernia or abnormality. No abdominopelvic ascites. Musculoskeletal: No acute or significant osseous findings. IMPRESSION: Stigmata of acute appendicitis and appendicolith. Correlate with clinical findings. Electronically Signed   By: Megan  Zare M.D.   On: 02/10/2024 13:58    US  Abdomen Limited RUQ (LIVER/GB) Result Date: 02/10/2024 CLINICAL DATA:  [id number] Abdominal pain [id number] EXAM: ULTRASOUND ABDOMEN LIMITED RIGHT UPPER QUADRANT COMPARISON:  June 16, 2023 FINDINGS: Gallbladder: No gallstones. No wall thickening or pericholecystic fluid. No sonographic Murphy's sign noted by sonographer. Common bile duct: Diameter: 2 mm Liver: Normal echogenicity. No focal lesion identified. No intrahepatic biliary ductal dilation. Portal vein is patent on color Doppler imaging with normal direction of blood flow towards the liver. Other: None. IMPRESSION: No cholecystolithiasis or changes of acute cholecystitis. Electronically Signed   By: Rogelia Myers M.D.   On: 02/10/2024 12:27      Assessment/Plan Acute appendicitis The patient has been seen, examined, labs, vitals, chart, and imaging personally reviewed.  He appears to have appendicitis with an appendicolith.  The recommendation has been to proceed to the OR for lap appy.  I have discussed the procedure and risks of appendectomy. The risks include but are not limited to bleeding, infection, wound problems, anesthesia, injury to intra-abdominal organs, possibility of postoperative ileus. He seems to understand and agrees with the plan.   FEN - NPO/IVFs VTE - SCDs in OR ID - Rocephin /Flagyl  Admit - obs vs home  I reviewed nursing notes, ED provider notes, last 24 h vitals and pain scores, last 24 h labs and trends, and last 24 h imaging results.   Jina LITTIE Nephew, MD, FACS, FSSO Surgical Oncology, General Surgery, Trauma and Critical [facility], [place] [phone] for weekday/non holidays Check [email or web address] for coverage night/weekend/holidays

## 2024-02-10 NOTE — Transfer of Care (Signed)
 Immediate Anesthesia Transfer of Care Note  Patient: Donald Brooks  Procedure(s) Performed: APPENDECTOMY, LAPAROSCOPIC  Patient Location: PACU  Anesthesia Type:General  Level of Consciousness: drowsy, patient cooperative, and responds to stimulation  Airway & Oxygen Therapy: Patient Spontanous Breathing  Post-op Assessment: Report given to RN, Post -op Vital signs reviewed and stable, and Patient moving all extremities X 4  Post vital signs: Reviewed and stable  Last Vitals:  Vitals Value Taken Time  BP 153/81 02/10/24 21:30  Temp    Pulse 71 02/10/24 21:30  Resp 22 02/10/24 21:30  SpO2 99 % 02/10/24 21:30  Vitals shown include unfiled device data.  Last Pain:  Vitals:   02/10/24 1820  TempSrc:   PainSc: 5          Complications: No notable events documented.

## 2024-02-10 NOTE — ED Triage Notes (Signed)
 Rt sided abd pain with nausea and vomiting since last night states hurts to walk

## 2024-02-10 NOTE — ED Notes (Signed)
 I V infiltrated and was taken out

## 2024-02-10 NOTE — ED Provider Notes (Signed)
 [place] [facility] Provider Note   CSN: [id number] Arrival date & time: 02/10/24  9074     Patient presents with: Abdominal Pain   Laymon Simson is a 23 y.o. male.    Abdominal Pain    Patient has a history of elevated BMI hypertriglyceridemia.  Patient presents to the ER for evaluation of abdominal pain.  Patient states he started having symptoms last evening.  Felt like a knot in his abdomen.  Symptoms persisted throughout the night.  Patient denies any trouble with nausea or vomiting.  Denies any diarrhea.  No dysuria.  No change in appetite.  Pain increases with palpation  Prior to Admission medications   Medication Sig Start Date End Date Taking? Authorizing Provider  diazepam  (VALIUM ) 5 MG tablet Take 1 tablet (5 mg total) by mouth every 8 (eight) hours as needed for muscle spasms. Take 1 tablet by mouth 30-60 minutes prior to flights. 01/20/24   Frann Mabel Mt, DO  fluticasone  (FLONASE ) 50 MCG/ACT nasal spray Place 2 sprays into both nostrils daily. 07/14/23   Stanhope, Catharine M, FNP  hydrocortisone (ANUSOL-HC) 2.5 % rectal cream Place 1 Application rectally 2 (two) times daily as needed for hemorrhoids or anal itching.    [provider]  ondansetron  (ZOFRAN -ODT) 4 MG disintegrating tablet Take 1 tablet (4 mg total) by mouth every 8 (eight) hours as needed for nausea or vomiting. 06/11/23   Enedelia Dorna HERO, FNP  oseltamivir  (TAMIFLU ) 75 MG capsule Take 1 capsule (75 mg total) by mouth every 12 (twelve) hours. Patient not taking: Reported on 06/20/2023 06/11/23   Enedelia Dorna HERO, FNP  pregabalin  (LYRICA ) 25 MG capsule Take 1 capsule (25 mg total) by mouth 2 (two) times daily. 06/14/23   Frann Mabel Mt, DO  promethazine -dextromethorphan (PROMETHAZINE -DM) 6.25-15 MG/5ML syrup Take 5 mLs by mouth at bedtime as needed for cough. 07/14/23   Enedelia Dorna HERO, FNP    Allergies: Sulfa antibiotics and  Sulfamethoxazole    Review of Systems  Gastrointestinal:  Positive for abdominal pain.    Updated Vital Signs BP 136/80 (BP Location: Right Arm)   Pulse 71   Temp 98.7 F (37.1 C) (Oral)   Resp 20   Ht 1.93 m (6' 4)   Wt 131.5 kg   BMI 35.30 kg/m   Physical Exam Vitals and nursing note reviewed.  Constitutional:      General: He is not in acute distress.    Appearance: He is well-developed.  HENT:     Head: Normocephalic and atraumatic.     Right Ear: External ear normal.     Left Ear: External ear normal.  Eyes:     General: No scleral icterus.       Right eye: No discharge.        Left eye: No discharge.     Conjunctiva/sclera: Conjunctivae normal.  Neck:     Trachea: No tracheal deviation.  Cardiovascular:     Rate and Rhythm: Normal rate and regular rhythm.  Pulmonary:     Effort: Pulmonary effort is normal. No respiratory distress.     Breath sounds: Normal breath sounds. No stridor. No wheezing or rales.  Abdominal:     General: Bowel sounds are normal. There is no distension.     Palpations: Abdomen is soft.     Tenderness: There is abdominal tenderness in the right upper quadrant. There is no guarding or rebound.  Musculoskeletal:  General: No tenderness or deformity.     Cervical back: Neck supple.  Skin:    General: Skin is warm and dry.     Findings: No rash.  Neurological:     General: No focal deficit present.     Mental Status: He is alert.     Cranial Nerves: No cranial nerve deficit, dysarthria or facial asymmetry.     Sensory: No sensory deficit.     Motor: No abnormal muscle tone or seizure activity.     Coordination: Coordination normal.  Psychiatric:        Mood and Affect: Mood normal.     (all labs ordered are listed, but only abnormal results are displayed) Labs Reviewed  COMPREHENSIVE METABOLIC PANEL WITH GFR - Abnormal; Notable for the following components:      Result Value   Glucose, Bld 105 (*)    All other components  within normal limits  CBC WITH DIFFERENTIAL/PLATELET - Abnormal; Notable for the following components:   WBC 11.5 (*)    Neutro Abs 8.5 (*)    All other components within normal limits  LIPASE, BLOOD  URINALYSIS, ROUTINE W REFLEX MICROSCOPIC    EKG: None  Radiology: CT ABDOMEN PELVIS W CONTRAST Result Date: 02/10/2024 CLINICAL DATA:  Right lower quadrant pain EXAM: CT ABDOMEN AND PELVIS WITH CONTRAST TECHNIQUE: Multidetector CT imaging of the abdomen and pelvis was performed using the standard protocol following bolus administration of intravenous contrast. RADIATION DOSE REDUCTION: This exam was performed according to the departmental dose-optimization program which includes automated exposure control, adjustment of the mA and/or kV according to patient size and/or use of iterative reconstruction technique. CONTRAST:  OMNIPAQUE  IOHEXOL  300 MG/ML  SOLN COMPARISON:  Ultrasound abdomen February 10, 2024. FINDINGS: Lower chest: No acute abnormality. Hepatobiliary: No focal liver abnormality is seen. Mild hepatic steatosis. No gallstones, gallbladder wall thickening, or biliary dilatation. Pancreas: Unremarkable. No pancreatic ductal dilatation or surrounding inflammatory changes. Spleen: Normal in size without focal abnormality.  Small splenule. Adrenals/Urinary Tract: Adrenal glands are unremarkable. Kidneys are normal, without renal calculi, focal lesion, or hydronephrosis. Bladder is unremarkable. Stomach/Bowel: Appendix is thickened measuring up to 9 mm. There is an appendicolith measuring 7 mm in mid appendix. Periappendiceal fat stranding . Multiple periappendiceal prominent pericentimeter and subcentimeter nodes likely reactive. No periappendiceal fluid collection. Scattered colonic diverticula. Otherwise no evidence of bowel wall thickening, distention, or inflammatory changes.Stomach is within normal limits. Vascular/Lymphatic: No significant vascular findings are present. No enlarged  abdominal or pelvic lymph nodes. Reproductive: Normal prostate. Other: No abdominal wall hernia or abnormality. No abdominopelvic ascites. Musculoskeletal: No acute or significant osseous findings. IMPRESSION: Stigmata of acute appendicitis and appendicolith. Correlate with clinical findings. Electronically Signed   By: Megan  Zare M.D.   On: 02/10/2024 13:58   US  Abdomen Limited RUQ (LIVER/GB) Result Date: 02/10/2024 CLINICAL DATA:  [id number] Abdominal pain [id number] EXAM: ULTRASOUND ABDOMEN LIMITED RIGHT UPPER QUADRANT COMPARISON:  June 16, 2023 FINDINGS: Gallbladder: No gallstones. No wall thickening or pericholecystic fluid. No sonographic Murphy's sign noted by sonographer. Common bile duct: Diameter: 2 mm Liver: Normal echogenicity. No focal lesion identified. No intrahepatic biliary ductal dilation. Portal vein is patent on color Doppler imaging with normal direction of blood flow towards the liver. Other: None. IMPRESSION: No cholecystolithiasis or changes of acute cholecystitis. Electronically Signed   By: Rogelia Myers M.D.   On: 02/10/2024 12:27     Procedures   Medications Ordered in the ED  cefTRIAXone  (ROCEPHIN ) 2 g in  sodium chloride  0.9 % 100 mL IVPB (has no administration in time range)    And  metroNIDAZOLE  (FLAGYL ) IVPB 500 mg (has no administration in time range)  ketorolac  (TORADOL ) 15 MG/ML injection 15 mg (15 mg Intravenous Given 02/10/24 1039)  iohexol  (OMNIPAQUE ) 300 MG/ML solution 100 mL (100 mLs Intravenous Contrast Given 02/10/24 1309)    Clinical Course as of 02/12/24 0922  Fri Feb 10, 2024  1012 CBC with Diff(!) Normal [JK]  1031 CBC with Diff(!) CBC shows leukocytosis.  Metabolic panel normal.  Lipase normal [JK]  1230 Ultrasound does not show any acute abnormalities [JK]  1234 Patient is to having some pain.  Lateral to the right umbilicus.  Will CT to rule out appendicitis [JK]  1412 CT scan shows findings concerning for acute appendicitis and appendicolith [JK]   1438 Case with PA [facility] general surgery.  We will have patient go to [facility] ED.  Plan on surgery today [JK]    Clinical Course User Index [JK] Randol Simmonds, MD                                 Medical Decision Making Problems Addressed: Acute appendicitis, unspecified acute appendicitis type: acute illness or injury that poses a threat to life or bodily functions  Amount and/or Complexity of Data Reviewed Labs: ordered. Decision-making details documented in ED Course. Radiology: ordered and independent interpretation performed.  Risk Prescription drug management. Decision regarding hospitalization.   Patient's ED workup notable for leukocytosis.  No signs of hepatitis or pancreatitis.  No signs of urinary tract infection or hematuria to suggest ureterolithiasis.  Initially concerned more about acute cholecystitis with the pain location.  However his ultrasound did not show any acute finding.  On repeat exam pain was more midline and lateral to the umbilicus.  CT scan was performed and it does show findings concerning for acute appendicitis without appendicolith.  I have started the patient on antibiotics.  I will consult with general surgery for admission further treatment.     Final diagnoses:  Acute appendicitis, unspecified acute appendicitis type    ED Discharge Orders     None          Randol Simmonds, MD 02/12/24 0922

## 2024-02-10 NOTE — Anesthesia Preprocedure Evaluation (Signed)
 Anesthesia Evaluation  Patient identified by MRN, date of birth, ID band Patient awake    Reviewed: Allergy & Precautions, H&P , NPO status , Patient's Chart, lab work & pertinent test results  Airway Mallampati: II  TM Distance: >3 FB Neck ROM: Full    Dental  (+) Teeth Intact, Dental Advisory Given   Pulmonary neg pulmonary ROS, neg shortness of breath, neg recent URI   breath sounds clear to auscultation       Cardiovascular negative cardio ROS  Rhythm:Regular     Neuro/Psych negative neurological ROS     GI/Hepatic Neg liver ROS,,,APPENDICITIS   Endo/Other  negative endocrine ROS    Renal/GU Lab Results      Component                Value               Date                      NA                       140                 02/10/2024                K                        3.8                 02/10/2024                CO2                      25                  02/10/2024                GLUCOSE                  105 (H)             02/10/2024                BUN                      16                  02/10/2024                CREATININE               0.78                02/10/2024                CALCIUM                  9.6                 02/10/2024                GFR                      121.77              11/23/2022                GFRNONAA                 >  60                 02/10/2024                Musculoskeletal negative musculoskeletal ROS (+)    Abdominal   Peds  Hematology negative hematology ROS (+)   Anesthesia Other Findings   Reproductive/Obstetrics                              Anesthesia Physical Anesthesia Plan  ASA: 1  Anesthesia Plan: General   Post-op Pain Management: Ofirmev  IV (intra-op)* and Toradol  IV (intra-op)*   Induction: Intravenous  PONV Risk Score and Plan: 3 and Ondansetron , Dexamethasone  and Midazolam   Airway Management Planned: Oral  ETT  Additional Equipment:   Intra-op Plan:   Post-operative Plan: Extubation in OR  Informed Consent: I have reviewed the patients History and Physical, chart, labs and discussed the procedure including the risks, benefits and alternatives for the proposed anesthesia with the patient or authorized representative who has indicated his/her understanding and acceptance.     Dental advisory given  Plan Discussed with: CRNA  Anesthesia Plan Comments:         Anesthesia Quick Evaluation

## 2024-02-10 NOTE — Discharge Instructions (Signed)

## 2024-02-12 NOTE — Anesthesia Postprocedure Evaluation (Signed)
 Anesthesia Post Note  Patient: Donald Brooks  Procedure(s) Performed: APPENDECTOMY, LAPAROSCOPIC     Patient location during evaluation: PACU Anesthesia Type: General Level of consciousness: awake and alert Pain management: pain level controlled Vital Signs Assessment: post-procedure vital signs reviewed and stable Respiratory status: spontaneous breathing, nonlabored ventilation and respiratory function stable Cardiovascular status: blood pressure returned to baseline and stable Postop Assessment: no apparent nausea or vomiting Anesthetic complications: no   No notable events documented.  Last Vitals:  Vitals:   02/10/24 2200 02/10/24 2211  BP: 130/69 130/69  Pulse: 60 60  Resp: 15 20  Temp: 36.8 C 36.8 C  SpO2: 94% 98%    Last Pain:  Vitals:   02/10/24 2230  TempSrc:   PainSc: 3                  Tressa Maldonado

## 2024-02-13 ENCOUNTER — Encounter (HOSPITAL_COMMUNITY): Payer: Self-pay | Admitting: General Surgery

## 2024-02-14 LAB — SURGICAL PATHOLOGY

## 2024-02-15 ENCOUNTER — Ambulatory Visit: Admitting: Family Medicine

## 2024-02-15 ENCOUNTER — Encounter: Payer: Self-pay | Admitting: Family Medicine

## 2024-02-15 VITALS — BP 120/80 | HR 70 | Temp 97.7°F | Resp 18 | Ht 76.0 in | Wt 294.6 lb

## 2024-02-15 DIAGNOSIS — R109 Unspecified abdominal pain: Secondary | ICD-10-CM

## 2024-02-15 NOTE — Patient Instructions (Signed)
Ice/cold pack over area for 10-15 min twice daily.  OK to take Tylenol 1000 mg (2 extra strength tabs) or 975 mg (3 regular strength tabs) every 6 hours as needed.  Ibuprofen 400-600 mg (2-3 over the counter strength tabs) every 6 hours as needed for pain.  Let us know if you need anything. 

## 2024-02-15 NOTE — Progress Notes (Signed)
 Chief Complaint  Patient presents with   Follow-up    Subjective: Patient is a 23 y.o. male here for hospital follow-up.  Patient had an appendectomy on 02/10/2024.  He went home the same day.  Reports abdominal wall pain but overall improved.  No fevers, nausea, vomiting, diarrhea, or bleeding.  His surgical incision sites are clean, dry, and intact.  There is no drainage or spreading redness.  He is using less of his opiate pain medication.  It does help him sleep.  He needs a letter for school excusing his unplanned absences.  Past Medical History:  Diagnosis Date   Abscess    Acanthosis nigricans 02/13/2018   Allergy    Chronic diarrhea 12/26/2019   Elevated triglycerides with high cholesterol 12/26/2019   Epistaxis 04/27/2017   Inattention 11/23/2017   Left leg pain 06/27/2018   Low HDL (under 40) 02/13/2018   Lower leg mass, left 11/23/2017   Nausea 12/26/2019   Obesity (BMI 30-39.9) 08/09/2022   Severe obesity due to excess calories without serious comorbidity with body mass index (BMI) greater than 99th percentile for age in pediatric patient (HCC) 11/23/2017    Objective: BP 120/80   Pulse 70   Temp 97.7 F (36.5 C)   Resp 18   Ht 6' 4 (1.93 m)   Wt 294 lb 9.6 oz (133.6 kg)   SpO2 98%   BMI 35.86 kg/m  General: Awake, appears stated age Heart: RRR, no LE edema Lungs: CTAB, no rales, wheezes or rhonchi. No accessory muscle use Skin: Incision sites of the left lower quadrant and umbilical region are c/d/I. Abd: BS+, S, ND, diffusely TTP, more intense near the surgical sites Psych: Age appropriate judgment and insight, normal affect and mood  Assessment and Plan: Abdominal wall pain  Keep follow-up appointment with the surgeon on 10/16.  Ice, Tylenol , try to reduce opiate medication.  Letter for school provided.  Follow-up with me as originally scheduled. The patient voiced understanding and agreement to the plan.  Mabel Mt [place], DO 02/15/24  9:54  AM

## 2024-02-16 ENCOUNTER — Encounter: Payer: Self-pay | Admitting: Family Medicine

## 2024-02-17 ENCOUNTER — Other Ambulatory Visit (HOSPITAL_COMMUNITY): Payer: Self-pay

## 2024-02-17 ENCOUNTER — Other Ambulatory Visit: Payer: Self-pay | Admitting: Family Medicine

## 2024-02-17 ENCOUNTER — Other Ambulatory Visit: Payer: Self-pay

## 2024-02-17 DIAGNOSIS — K645 Perianal venous thrombosis: Secondary | ICD-10-CM

## 2024-02-17 MED ORDER — PREGABALIN 25 MG PO CAPS
25.0000 mg | ORAL_CAPSULE | Freq: Two times a day (BID) | ORAL | 0 refills | Status: DC
  Filled 2024-02-17: qty 60, 30d supply, fill #0

## 2024-02-18 ENCOUNTER — Other Ambulatory Visit (HOSPITAL_COMMUNITY): Payer: Self-pay

## 2024-02-18 ENCOUNTER — Emergency Department (HOSPITAL_COMMUNITY)
Admission: EM | Admit: 2024-02-18 | Discharge: 2024-02-18 | Disposition: A | Discharge status: Home / Self Care | Attending: Emergency Medicine | Admitting: Emergency Medicine

## 2024-02-18 ENCOUNTER — Other Ambulatory Visit: Payer: Self-pay

## 2024-02-18 ENCOUNTER — Encounter (HOSPITAL_COMMUNITY): Payer: Self-pay | Admitting: Emergency Medicine

## 2024-02-18 ENCOUNTER — Emergency Department (HOSPITAL_COMMUNITY)

## 2024-02-18 DIAGNOSIS — R109 Unspecified abdominal pain: Secondary | ICD-10-CM | POA: Insufficient documentation

## 2024-02-18 DIAGNOSIS — R110 Nausea: Secondary | ICD-10-CM | POA: Insufficient documentation

## 2024-02-18 LAB — CBC
HCT: 49.3 % (ref 39.0–52.0)
Hemoglobin: 16.2 g/dL (ref 13.0–17.0)
MCH: 28.7 pg (ref 26.0–34.0)
MCHC: 32.9 g/dL (ref 30.0–36.0)
MCV: 87.4 fL (ref 80.0–100.0)
Platelets: 351 K/uL (ref 150–400)
RBC: 5.64 MIL/uL (ref 4.22–5.81)
RDW: 13.1 % (ref 11.5–15.5)
WBC: 10 K/uL (ref 4.0–10.5)
nRBC: 0 % (ref 0.0–0.2)

## 2024-02-18 LAB — LIPASE, BLOOD: Lipase: 33 U/L (ref 11–51)

## 2024-02-18 LAB — URINALYSIS, ROUTINE W REFLEX MICROSCOPIC
Bilirubin Urine: NEGATIVE
Glucose, UA: NEGATIVE mg/dL
Hgb urine dipstick: NEGATIVE
Ketones, ur: NEGATIVE mg/dL
Leukocytes,Ua: NEGATIVE
Nitrite: NEGATIVE
Protein, ur: NEGATIVE mg/dL
Specific Gravity, Urine: 1.02 (ref 1.005–1.030)
pH: 5 (ref 5.0–8.0)

## 2024-02-18 LAB — COMPREHENSIVE METABOLIC PANEL WITH GFR
ALT: 27 U/L (ref 0–44)
AST: 23 U/L (ref 15–41)
Albumin: 4.7 g/dL (ref 3.5–5.0)
Alkaline Phosphatase: 46 U/L (ref 38–126)
Anion gap: 14 (ref 5–15)
BUN: 17 mg/dL (ref 6–20)
CO2: 21 mmol/L — ABNORMAL LOW (ref 22–32)
Calcium: 9.9 mg/dL (ref 8.9–10.3)
Chloride: 102 mmol/L (ref 98–111)
Creatinine, Ser: 0.98 mg/dL (ref 0.61–1.24)
GFR, Estimated: >60 mL/min (ref >60)
Glucose, Bld: 110 mg/dL — ABNORMAL HIGH (ref 70–99)
Potassium: 3.6 mmol/L (ref 3.5–5.1)
Sodium: 137 mmol/L (ref 135–145)
Total Bilirubin: 0.6 mg/dL (ref 0.0–1.2)
Total Protein: 8.3 g/dL — ABNORMAL HIGH (ref 6.5–8.1)

## 2024-02-18 MED ORDER — SODIUM CHLORIDE 0.9 % IV BOLUS
1000.0000 mL | Freq: Once | INTRAVENOUS | Status: AC
  Administered 2024-02-18: 1000 mL via INTRAVENOUS

## 2024-02-18 MED ORDER — HYOSCYAMINE SULFATE 0.125 MG PO TABS
0.2500 mg | ORAL_TABLET | Freq: Once | ORAL | Status: DC
Start: 2024-02-18 — End: 2024-02-18

## 2024-02-18 MED ORDER — HYDROMORPHONE HCL 1 MG/ML IJ SOLN: 0.5000 mg | INTRAMUSCULAR | Status: DC | PRN

## 2024-02-18 MED ORDER — HYOSCYAMINE SULFATE 0.125 MG SL SUBL
0.2500 mg | SUBLINGUAL_TABLET | Freq: Once | SUBLINGUAL | Status: AC
  Administered 2024-02-18: 0.25 mg via ORAL
  Filled 2024-02-18: qty 2

## 2024-02-18 MED ORDER — HYOSCYAMINE SULFATE SL 0.125 MG SL SUBL
1.0000 | SUBLINGUAL_TABLET | Freq: Four times a day (QID) | SUBLINGUAL | 0 refills | Status: AC | PRN
  Filled 2024-02-18: qty 30, 8d supply, fill #0

## 2024-02-18 MED ORDER — ONDANSETRON HCL 4 MG/2ML IJ SOLN
4.0000 mg | Freq: Once | INTRAMUSCULAR | Status: AC
  Administered 2024-02-18: 4 mg via INTRAVENOUS
  Filled 2024-02-18: qty 2

## 2024-02-18 MED ORDER — IOHEXOL 300 MG/ML  SOLN
100.0000 mL | Freq: Once | INTRAMUSCULAR | Status: AC | PRN
Start: 2024-02-18 — End: 2024-02-18
  Administered 2024-02-18: 100 mL via INTRAVENOUS

## 2024-02-18 NOTE — ED Notes (Signed)
Pt given blanket.

## 2024-02-18 NOTE — ED Triage Notes (Signed)
 Patient c/o RUQ abdominal pain x 1 day. Patient report taking PRN medication without relief. Patient report nausea , denies vomiting. Patient denies fever.  S/P Appendectomy 9/19

## 2024-02-18 NOTE — ED Notes (Signed)
 Patient transported to CT

## 2024-02-18 NOTE — ED Provider Notes (Signed)
 [place] EMERGENCY DEPARTMENT AT [facility] Provider Note  CSN: [id number] Arrival date & time: 02/18/24 0106  Chief Complaint(s) Abdominal Pain  HPI Donald Brooks is a 23 y.o. male with a past medical history listed below including recent appendectomy on September 19 here for 1 to 2 days of right-sided abdominal pain gradually worsening.  Associated with nausea but no emesis.  Pain denies any fevers.  Taking over-the-counter medication with no relief.  No diarrhea.  He did report dysuria for several days following surgery but that has resolved.   Abdominal Pain   Past Medical History Past Medical History:  Diagnosis Date   Abscess    Acanthosis nigricans 02/13/2018   Allergy    Chronic diarrhea 12/26/2019   Elevated triglycerides with high cholesterol 12/26/2019   Epistaxis 04/27/2017   Inattention 11/23/2017   Left leg pain 06/27/2018   Low HDL (under 40) 02/13/2018   Lower leg mass, left 11/23/2017   Nausea 12/26/2019   Obesity (BMI 30-39.9) 08/09/2022   Severe obesity due to excess calories without serious comorbidity with body mass index (BMI) greater than 99th percentile for age in pediatric patient (HCC) 11/23/2017   Patient Active Problem List   Diagnosis Date Noted   Abscess    Allergy    Obesity (BMI 30-39.9) 08/09/2022   Elevated triglycerides with high cholesterol 12/26/2019   Nausea 12/26/2019   Chronic diarrhea 12/26/2019   Left leg pain 06/27/2018   Acanthosis nigricans 02/13/2018   Low HDL (under 40) 02/13/2018   Inattention 11/23/2017   Severe obesity due to excess calories without serious comorbidity with body mass index (BMI) greater than 99th percentile for age in pediatric patient (HCC) 11/23/2017   Lower leg mass, left 11/23/2017   Epistaxis 04/27/2017   Home Medication(s) Prior to Admission medications   Medication Sig Start Date End Date Taking? Authorizing Provider  acetaminophen  (TYLENOL ) 500 MG tablet Take 500 mg by mouth  every 6 (six) hours as needed.   Yes [provider]  diazepam  (VALIUM ) 5 MG tablet Take 1 tablet (5 mg total) by mouth every 8 (eight) hours as needed for muscle spasms. Take 1 tablet by mouth 30-60 minutes prior to flights. 01/20/24  Yes Wendling, Mabel Mt, DO  Hyoscyamine Sulfate SL (LEVSIN/SL) 0.125 MG SUBL Place 1 tablet (0.125 mg total) under the tongue 4 (four) times daily as needed for up to 5 days. 02/18/24 02/23/24 Yes Frannie Shedrick, Raynell Moder, MD  ibuprofen (ADVIL) 200 MG tablet Take 200 mg by mouth every 6 (six) hours as needed.   Yes [provider]  fluticasone  (FLONASE ) 50 MCG/ACT nasal spray Place 2 sprays into both nostrils daily. Patient not taking: Reported on 02/18/2024 07/14/23   Enedelia Dorna HERO, FNP  ondansetron  (ZOFRAN -ODT) 4 MG disintegrating tablet Take 1 tablet (4 mg total) by mouth every 8 (eight) hours as needed for nausea or vomiting. 02/10/24   Aron Shoulders, MD  oxyCODONE  (OXY IR/ROXICODONE ) 5 MG immediate release tablet Take 1 tablet (5 mg total) by mouth every 6 (six) hours as needed for severe pain (pain score 7-10). Patient not taking: Reported on 02/18/2024 02/10/24   Aron Shoulders, MD  pregabalin  (LYRICA ) 25 MG capsule Take 1 capsule (25 mg total) by mouth 2 (two) times daily. 02/17/24   Frann Mabel Mt, DO  Allergies Sulfa antibiotics and Sulfamethoxazole  Review of Systems Review of Systems  Gastrointestinal:  Positive for abdominal pain.   As noted in HPI  Physical Exam Vital Signs  I have reviewed the triage vital signs BP 139/78   Pulse 87   Temp 98.5 F (36.9 C) (Oral)   Resp 18   SpO2 100%   Physical Exam Vitals reviewed.  Constitutional:      General: He is not in acute distress.    Appearance: He is well-developed. He is obese. He is not diaphoretic.  HENT:     Head: Normocephalic  and atraumatic.     Right Ear: External ear normal.     Left Ear: External ear normal.     Nose: Nose normal.     Mouth/Throat:     Mouth: Mucous membranes are moist.  Eyes:     General: No scleral icterus.    Conjunctiva/sclera: Conjunctivae normal.  Neck:     Trachea: Phonation normal.  Cardiovascular:     Rate and Rhythm: Normal rate and regular rhythm.  Pulmonary:     Effort: Pulmonary effort is normal. No respiratory distress.     Breath sounds: No stridor.  Abdominal:     General: There is no distension.     Tenderness: There is abdominal tenderness (right flank).   Musculoskeletal:        General: Normal range of motion.     Cervical back: Normal range of motion.  Neurological:     Mental Status: He is alert and oriented to person, place, and time.  Psychiatric:        Behavior: Behavior normal.     ED Results and Treatments Labs (all labs ordered are listed, but only abnormal results are displayed) Labs Reviewed  COMPREHENSIVE METABOLIC PANEL WITH GFR - Abnormal; Notable for the following components:      Result Value   CO2 21 (*)    Glucose, Bld 110 (*)    Total Protein 8.3 (*)    All other components within normal limits  LIPASE, BLOOD  CBC  URINALYSIS, ROUTINE W REFLEX MICROSCOPIC                                                                                                                         EKG  EKG Interpretation Date/Time:    Ventricular Rate:    PR Interval:    QRS Duration:    QT Interval:    QTC Calculation:   R Axis:      Text Interpretation:         Radiology CT ABDOMEN PELVIS W CONTRAST Result Date: 02/18/2024 CLINICAL DATA:  23 year old male status post appendectomy on 02/10/2024. Postoperative abdominal pain. EXAM: CT ABDOMEN AND PELVIS WITH CONTRAST TECHNIQUE: Multidetector CT imaging of the abdomen and pelvis was performed using the standard protocol following bolus administration of intravenous contrast. RADIATION DOSE  REDUCTION: This exam was performed according to the departmental dose-optimization program which includes automated exposure  control, adjustment of the mA and/or kV according to patient size and/or use of iterative reconstruction technique. CONTRAST:  OMNIPAQUE  IOHEXOL  300 MG/ML  SOLN COMPARISON:  CT Abdomen and Pelvis 02/10/2024 and earlier. FINDINGS: Lower chest: Negative.  No pericardial or pleural effusion. Hepatobiliary: Negative liver and gallbladder. Pancreas: Negative. Spleen: Negative. Adrenals/Urinary Tract: Negative. Nonobstructed kidneys with symmetric renal enhancement and early contrast excretion. Mildly distended urinary bladder. Stomach/Bowel: Midline ventral abdominal wall surgical changes, especially at the umbilicus. Postoperative soft tissue stranding there with no abdominal wall fluid collection or gas. No ventral abdominal hernia. Nondilated large and small bowel loops in the abdomen and pelvis. Sequelae of appendectomy at the tip of the cecum on coronal image 104. Nearby terminal ileum is fluid-filled but nondilated. No active large bowel inflammation identified. Redundant transverse colon. Small volume retained food in the stomach. Decompressed duodenum. No pneumoperitoneum, free fluid, or active mesentery inflammation is identified. Vascular/Lymphatic: Major arterial structures appear patent and normal. Grossly patent portal venous system. No lymphadenopathy. Reproductive: Negative. Other: No pelvis free fluid. Musculoskeletal: Unilateral right L5 pars fracture with subtle L5-S1 spondylolisthesis. Stable visualized osseous structures. IMPRESSION: Sequelae of recent appendectomy with no adverse features identified. No new inflammatory process identified in the abdomen or pelvis. Electronically Signed   By: VEAR Hurst M.D.   On: 02/18/2024 05:36    Medications Ordered in ED Medications  HYDROmorphone (DILAUDID) injection 0.5 mg (has no administration in time range)  sodium chloride   0.9 % bolus 1,000 mL (0 mLs Intravenous Stopped 02/18/24 0420)  ondansetron  (ZOFRAN ) injection 4 mg (4 mg Intravenous Given 02/18/24 0224)  iohexol  (OMNIPAQUE ) 300 MG/ML solution 100 mL (100 mLs Intravenous Contrast Given 02/18/24 0429)  hyoscyamine (LEVSIN SL) SL tablet 0.25 mg (0.25 mg Oral Given 02/18/24 9447)   Procedures Procedures  (including critical care time) Medical Decision Making / ED Course   Medical Decision Making Amount and/or Complexity of Data Reviewed Labs: ordered. Decision-making details documented in ED Course. Radiology: ordered and independent interpretation performed. Decision-making details documented in ED Course.  Risk Prescription drug management. Parenteral controlled substances. Decision regarding hospitalization.    Right-sided abdominal pain.  Differential diagnosis considered.  Workup below. Will need to assess for complication from surgery including intra-abdominal infection. Patient has had prior imaging in the last several months with a negative right upper quadrant ultrasound for cholelithiasis.  Unlikely biliary disease.  No renal stones seen on CT images either.  CBC without leukocytosis or anemia.  CMP without significant electrolyte derangement or renal sufficiency.  No  biliary obstruction or pancreatitis.  UA without evidence of infection.  CT scan negative for any acute intra-abdominal inflammatory/infectious process or postoperative complications.  Pain improved.     Final Clinical Impression(s) / ED Diagnoses Final diagnoses:  Right lateral abdominal pain   The patient appears reasonably screened and/or stabilized for discharge and I doubt any other medical condition or other [facility] requiring further screening, evaluation, or treatment in the ED at this time. I have discussed the findings, Dx and Tx plan with the patient/family who expressed understanding and agree(s) with the plan. Discharge instructions discussed at length. The  patient/family was given strict return precautions who verbalized understanding of the instructions. No further questions at time of discharge.  Disposition: Discharge  Condition: Good  ED Discharge Orders          Ordered    Hyoscyamine Sulfate SL (LEVSIN/SL) 0.125 MG SUBL  4 times daily PRN        02/18/24  9375              Follow Up: Frann Mabel Mt, DO [address] [place] [address] [phone]  Call  to schedule an appointment for close follow up     This chart was dictated using voice recognition software.  Despite best efforts to proofread,  errors can occur which can change the documentation meaning.    Trine Raynell Moder, MD 02/18/24 [phone]

## 2024-02-20 ENCOUNTER — Other Ambulatory Visit: Payer: Self-pay | Admitting: Family Medicine

## 2024-02-20 ENCOUNTER — Other Ambulatory Visit: Payer: Self-pay

## 2024-02-20 ENCOUNTER — Other Ambulatory Visit (HOSPITAL_COMMUNITY): Payer: Self-pay

## 2024-02-20 DIAGNOSIS — F40243 Fear of flying: Secondary | ICD-10-CM

## 2024-02-20 MED ORDER — DIAZEPAM 5 MG PO TABS
5.0000 mg | ORAL_TABLET | Freq: Three times a day (TID) | ORAL | 0 refills | Status: DC | PRN
  Filled 2024-02-20: qty 15, 5d supply, fill #0

## 2024-02-20 NOTE — Telephone Encounter (Signed)
 Requesting: diazepam  5mg   Contract: None LID:[id number] Last Visit: 02/15/24 Next Visit: None Last Refill: 01/20/24 #15 and 0RF    Please Advise

## 2024-02-21 ENCOUNTER — Other Ambulatory Visit (HOSPITAL_COMMUNITY): Payer: Self-pay

## 2024-03-27 ENCOUNTER — Other Ambulatory Visit: Payer: Self-pay | Admitting: Family Medicine

## 2024-03-27 ENCOUNTER — Other Ambulatory Visit (HOSPITAL_COMMUNITY): Payer: Self-pay

## 2024-03-27 ENCOUNTER — Other Ambulatory Visit: Payer: Self-pay

## 2024-03-27 DIAGNOSIS — F419 Anxiety disorder, unspecified: Secondary | ICD-10-CM

## 2024-03-27 MED ORDER — DIAZEPAM 5 MG PO TABS
5.0000 mg | ORAL_TABLET | Freq: Three times a day (TID) | ORAL | 0 refills | Status: DC | PRN
  Filled 2024-03-27: qty 15, 5d supply, fill #0

## 2024-03-27 NOTE — Telephone Encounter (Signed)
 Requesting: diazepam  5mg   Contract: None UDS: None Last Visit: 02/15/24 Next Visit: None Last Refill: 02/20/24 #15 and 0RF   Please Advise

## 2024-03-28 ENCOUNTER — Other Ambulatory Visit (HOSPITAL_COMMUNITY): Payer: Self-pay

## 2024-03-28 ENCOUNTER — Other Ambulatory Visit: Payer: Self-pay | Admitting: Family Medicine

## 2024-03-28 DIAGNOSIS — K645 Perianal venous thrombosis: Secondary | ICD-10-CM

## 2024-03-28 MED ORDER — PREGABALIN 25 MG PO CAPS
25.0000 mg | ORAL_CAPSULE | Freq: Two times a day (BID) | ORAL | 2 refills | Status: AC
  Filled 2024-03-28: qty 60, 30d supply, fill #0
  Filled 2024-05-09: qty 60, 30d supply, fill #1
  Filled 2024-06-27: qty 60, 30d supply, fill #2

## 2024-03-28 MED ORDER — PREGABALIN 25 MG PO CAPS
25.0000 mg | ORAL_CAPSULE | Freq: Two times a day (BID) | ORAL | 0 refills | Status: DC
Start: 2024-03-28 — End: 2024-03-28

## 2024-03-28 NOTE — Addendum Note (Signed)
 Addended by: FRANN MABEL SQUIBB on: 03/28/2024 01:31 PM   Modules accepted: Orders

## 2024-04-03 ENCOUNTER — Ambulatory Visit: Admitting: Family Medicine

## 2024-04-03 ENCOUNTER — Encounter: Payer: Self-pay | Admitting: Family Medicine

## 2024-04-03 VITALS — BP 134/78 | HR 84 | Temp 98.0°F | Resp 16 | Ht 76.0 in | Wt 291.0 lb

## 2024-04-03 DIAGNOSIS — J020 Streptococcal pharyngitis: Secondary | ICD-10-CM | POA: Diagnosis not present

## 2024-04-03 DIAGNOSIS — J029 Acute pharyngitis, unspecified: Secondary | ICD-10-CM | POA: Diagnosis not present

## 2024-04-03 LAB — POC COVID19 BINAXNOW: SARS Coronavirus 2 Ag: NEGATIVE

## 2024-04-03 LAB — POCT INFLUENZA A/B
Influenza A, POC: NEGATIVE
Influenza B, POC: NEGATIVE

## 2024-04-03 LAB — POCT RAPID STREP A (OFFICE): Rapid Strep A Screen: POSITIVE — AB

## 2024-04-03 MED ORDER — PENICILLIN G BENZATHINE & PROC 1200000 UNIT/2ML IM SUSP
1.2000 10*6.[IU] | Freq: Once | INTRAMUSCULAR | Status: AC
  Administered 2024-04-03: 1.2 10*6.[IU] via INTRAMUSCULAR

## 2024-04-03 NOTE — Patient Instructions (Signed)
 Consider throat lozenges, salt water gargles and an air humidifier for symptomatic care.   OK to take Tylenol 1000 mg (2 extra strength tabs) or 975 mg (3 regular strength tabs) every 6 hours as needed.  Ibuprofen 400-600 mg (2-3 over the counter strength tabs) every 6 hours as needed for pain.  Let us know if you need anything.

## 2024-04-03 NOTE — Progress Notes (Signed)
 SUBJECTIVE:   Donald Brooks is a 23 y.o. male presents to the clinic for:  Chief Complaint  Patient presents with   Sore Throat    Sore Throat    Complains of sore throat for 2 days.  Other associated symptoms: subjective fever, sinus headache, sinus congestion, rhinorrhea, sore throat, and slight cough, slight ear pain.  Denies: sinus pain, itchy watery eyes, ear pain, ear drainage, wheezing, shortness of breath, myalgia, and N/V/D Sick Contacts: none known Therapy to date: Dayquil, Nyquil, ibuprofen  Social History   Tobacco Use  Smoking Status Never  Smokeless Tobacco Never    Patient's medications, allergies, past medical, surgical, social and family histories were reviewed and updated as appropriate.  OBJECTIVE:  BP 134/78 (BP Location: Left Arm, Patient Position: Sitting)   Pulse 84   Temp 98 F (36.7 C) (Oral)   Resp 16   Ht 6' 4 (1.93 m)   Wt 291 lb (132 kg)   SpO2 99%   BMI 35.42 kg/m  General: Awake, alert, appearing stated age Eyes: conjunctivae and sclerae clear Ears: normal TMs bilaterally Nose: no visible exudate Oropharynx: MMM, tonsillar pillars erythematous without exudate Neck: supple, no significant adenopathy Lungs: clear to auscultation, no wheezes, rales or rhonchi, symmetric air entry, normal effort Heart: RRR Skin:reveals no rash Psych: Age appropriate judgment and insight  ASSESSMENT/PLAN:  Strep throat  Sore throat - Plan: POCT rapid strep A, POCT Influenza A/B, POC COVID-19  PCN 1.2 mill u today. Continue to practice good hand hygiene and push fluids. Ibuprofen and acetaminophen  for pain. Replace toothbrush after 24 hours of being on abx. F/u prn. Pt voiced understanding and agreement to the plan.  Mabel Mt [place], DO 04/03/24 3:18 PM

## 2024-04-03 NOTE — Addendum Note (Signed)
 Addended by: Corry Ihnen M on: 04/03/2024 03:33 PM   Modules accepted: Orders

## 2024-04-09 ENCOUNTER — Encounter: Payer: Self-pay | Admitting: Family Medicine

## 2024-04-09 ENCOUNTER — Other Ambulatory Visit: Payer: Self-pay

## 2024-04-09 DIAGNOSIS — K6289 Other specified diseases of anus and rectum: Secondary | ICD-10-CM

## 2024-05-04 ENCOUNTER — Encounter: Payer: Self-pay | Admitting: Family Medicine

## 2024-05-09 ENCOUNTER — Other Ambulatory Visit: Payer: Self-pay

## 2024-05-09 ENCOUNTER — Other Ambulatory Visit (HOSPITAL_COMMUNITY): Payer: Self-pay

## 2024-05-09 ENCOUNTER — Telehealth: Payer: Self-pay

## 2024-05-09 ENCOUNTER — Other Ambulatory Visit: Payer: Self-pay | Admitting: Family Medicine

## 2024-05-09 DIAGNOSIS — F419 Anxiety disorder, unspecified: Secondary | ICD-10-CM

## 2024-05-09 MED ORDER — DIAZEPAM 5 MG PO TABS
5.0000 mg | ORAL_TABLET | Freq: Three times a day (TID) | ORAL | 0 refills | Status: DC | PRN
  Filled 2024-05-09: qty 15, 5d supply, fill #0

## 2024-05-09 NOTE — Telephone Encounter (Signed)
 Called pt was advised he would need appt to discuss  Back pain problems for the form regarding the Gym. Pt schedule appt o discuss.

## 2024-05-09 NOTE — Telephone Encounter (Signed)
 Requesting: diazepam  5mg  Contract: None UDS: None Last Visit: 04/03/24 Next Visit: None Last Refill: 03/27/24 #15 and 0RF   Please Advise

## 2024-05-11 ENCOUNTER — Ambulatory Visit: Admitting: Family Medicine

## 2024-05-11 ENCOUNTER — Encounter: Payer: Self-pay | Admitting: Family Medicine

## 2024-05-11 ENCOUNTER — Other Ambulatory Visit (HOSPITAL_COMMUNITY): Payer: Self-pay

## 2024-05-11 VITALS — BP 128/80 | HR 97 | Temp 98.0°F | Resp 16 | Ht 76.0 in | Wt 302.0 lb

## 2024-05-11 DIAGNOSIS — M545 Low back pain, unspecified: Secondary | ICD-10-CM | POA: Diagnosis not present

## 2024-05-11 DIAGNOSIS — K602 Anal fissure, unspecified: Secondary | ICD-10-CM | POA: Insufficient documentation

## 2024-05-11 MED ORDER — MELOXICAM 15 MG PO TABS
15.0000 mg | ORAL_TABLET | Freq: Every day | ORAL | 0 refills | Status: DC
  Filled 2024-05-11: qty 21, 21d supply, fill #0

## 2024-05-11 NOTE — Progress Notes (Signed)
 Musculoskeletal Exam  Patient: Donald Brooks DOB: [birth year]  DOS: 05/11/2024  SUBJECTIVE:  Chief Complaint:   Chief Complaint  Patient presents with   Back Pain    Back Pain    Donald Brooks is a 23 y.o.  male for evaluation and treatment of back pain.   Onset:  3 weeks ago.  No inj or chang ein activity.  Location: lower R Character:  aching  Progression of issue:  is unchanged Associated symptoms: no bruising, redness, swelling Denies bowel/bladder incontinence or weakness Treatment: to date has been OTC NSAIDS.   Neurovascular symptoms: no  2.5 yr hx of anal fissures. Seeing colorectal and getting botox injections. Cannot bear down or exercise with it.   Past Medical History:  Diagnosis Date   Abscess    Acanthosis nigricans 02/13/2018   Allergy    Chronic diarrhea 12/26/2019   Elevated triglycerides with high cholesterol 12/26/2019   Epistaxis 04/27/2017   Inattention 11/23/2017   Left leg pain 06/27/2018   Low HDL (under 40) 02/13/2018   Lower leg mass, left 11/23/2017   Nausea 12/26/2019   Obesity (BMI 30-39.9) 08/09/2022   Severe obesity due to excess calories without serious comorbidity with body mass index (BMI) greater than 99th percentile for age in pediatric patient (HCC) 11/23/2017    Objective:  VITAL SIGNS: BP 128/80 (BP Location: Left Arm, Patient Position: Sitting)   Pulse 97   Temp 98 F (36.7 C) (Oral)   Resp 16   Ht 6' 4 (1.93 m)   Wt (!) 302 lb (137 kg)   SpO2 95%   BMI 36.76 kg/m  Constitutional: Well formed, well developed. No acute distress. HENT: Normocephalic, atraumatic.  Thorax & Lungs:  No accessory muscle use Musculoskeletal: low back.   Tenderness to palpation: yes over thoracolumbar parasp on the R Deformity: no Ecchymosis: no Straight leg test: negative for Poor hamstring flexibility b/l. Neurologic: Normal sensory function. No focal deficits noted. DTR's equal and symmetric in LE's. No clonus. 5/5 strength in LE  b/l Psychiatric: Normal mood. Age appropriate judgment and insight. Alert & oriented x 3.    Assessment:  Anal fissure  Acute right-sided low back pain without sciatica - Plan: meloxicam  (MOBIC ) 15 MG tablet  Plan: 1. Anal fissures still bother him and keeping him from exering himself. Will fill out form for gym.  2. Stretches/exercises, heat, ice, Tylenol , NSAIDs. F/u prn. The patient voiced understanding and agreement to the plan.   Mabel Mt [place], DO 05/11/2024  3:31 PM

## 2024-05-11 NOTE — Patient Instructions (Signed)

## 2024-06-27 ENCOUNTER — Other Ambulatory Visit (HOSPITAL_COMMUNITY): Payer: Self-pay

## 2024-06-27 ENCOUNTER — Other Ambulatory Visit: Payer: Self-pay

## 2024-06-27 ENCOUNTER — Other Ambulatory Visit: Payer: Self-pay | Admitting: Family Medicine

## 2024-06-27 DIAGNOSIS — M545 Low back pain, unspecified: Secondary | ICD-10-CM

## 2024-06-27 DIAGNOSIS — F419 Anxiety disorder, unspecified: Secondary | ICD-10-CM

## 2024-06-27 MED ORDER — DIAZEPAM 5 MG PO TABS
5.0000 mg | ORAL_TABLET | Freq: Three times a day (TID) | ORAL | 0 refills | Status: AC | PRN
  Filled 2024-06-27: qty 15, 5d supply, fill #0

## 2024-06-27 MED ORDER — MELOXICAM 15 MG PO TABS
15.0000 mg | ORAL_TABLET | Freq: Every day | ORAL | 0 refills | Status: AC
  Filled 2024-06-27: qty 21, 21d supply, fill #0

## 2024-06-27 NOTE — Telephone Encounter (Signed)
 Requesting: diazepam  5mg   Contract: None UDS: None Last Visit: 05/11/24 Next Visit: None Last Refill: 05/09/24 #15 and 0RF   Please Advise

## 2024-06-28 ENCOUNTER — Other Ambulatory Visit (HOSPITAL_COMMUNITY): Payer: Self-pay
